# Patient Record
Sex: Female | Born: 1980 | Race: White | Hispanic: No | Marital: Single | Smoking: Current some day smoker
Health system: Southern US, Community
[De-identification: ages and names within clinical notes are randomized; demographics above are authoritative.]

---

## 2000-05-24 ENCOUNTER — Emergency Department (HOSPITAL_COMMUNITY): Admission: EM | Admit: 2000-05-24 | Discharge: 2000-05-24 | Payer: Self-pay | Admitting: Emergency Medicine

## 2000-09-12 ENCOUNTER — Emergency Department (HOSPITAL_COMMUNITY): Admission: EM | Admit: 2000-09-12 | Discharge: 2000-09-12 | Payer: Self-pay | Admitting: Emergency Medicine

## 2001-07-01 ENCOUNTER — Emergency Department (HOSPITAL_COMMUNITY): Admission: EM | Admit: 2001-07-01 | Discharge: 2001-07-01 | Payer: Self-pay

## 2001-07-10 ENCOUNTER — Emergency Department (HOSPITAL_COMMUNITY): Admission: EM | Admit: 2001-07-10 | Discharge: 2001-07-10 | Payer: Self-pay | Admitting: Emergency Medicine

## 2001-07-12 ENCOUNTER — Emergency Department (HOSPITAL_COMMUNITY): Admission: EM | Admit: 2001-07-12 | Discharge: 2001-07-12 | Payer: Self-pay | Admitting: Emergency Medicine

## 2001-07-12 ENCOUNTER — Encounter: Payer: Self-pay | Admitting: Emergency Medicine

## 2001-10-22 ENCOUNTER — Emergency Department (HOSPITAL_COMMUNITY): Admission: EM | Admit: 2001-10-22 | Discharge: 2001-10-22 | Payer: Self-pay | Admitting: Emergency Medicine

## 2001-11-06 ENCOUNTER — Emergency Department (HOSPITAL_COMMUNITY): Admission: EM | Admit: 2001-11-06 | Discharge: 2001-11-06 | Payer: Self-pay | Admitting: Emergency Medicine

## 2001-12-07 ENCOUNTER — Emergency Department (HOSPITAL_COMMUNITY): Admission: EM | Admit: 2001-12-07 | Discharge: 2001-12-07 | Payer: Self-pay

## 2002-04-08 ENCOUNTER — Encounter (INDEPENDENT_AMBULATORY_CARE_PROVIDER_SITE_OTHER): Payer: Self-pay | Admitting: Specialist

## 2002-04-08 ENCOUNTER — Ambulatory Visit (HOSPITAL_BASED_OUTPATIENT_CLINIC_OR_DEPARTMENT_OTHER): Admission: RE | Admit: 2002-04-08 | Discharge: 2002-04-08 | Payer: Self-pay | Admitting: Orthopedic Surgery

## 2002-04-23 ENCOUNTER — Emergency Department (HOSPITAL_COMMUNITY): Admission: EM | Admit: 2002-04-23 | Discharge: 2002-04-23 | Payer: Self-pay | Admitting: Emergency Medicine

## 2002-08-15 ENCOUNTER — Emergency Department (HOSPITAL_COMMUNITY): Admission: EM | Admit: 2002-08-15 | Discharge: 2002-08-15 | Payer: Self-pay

## 2002-10-15 ENCOUNTER — Emergency Department (HOSPITAL_COMMUNITY): Admission: EM | Admit: 2002-10-15 | Discharge: 2002-10-15 | Payer: Self-pay | Admitting: Emergency Medicine

## 2003-03-29 ENCOUNTER — Emergency Department (HOSPITAL_COMMUNITY): Admission: EM | Admit: 2003-03-29 | Discharge: 2003-03-29 | Payer: Self-pay | Admitting: Emergency Medicine

## 2003-06-28 ENCOUNTER — Emergency Department (HOSPITAL_COMMUNITY): Admission: EM | Admit: 2003-06-28 | Discharge: 2003-06-28 | Payer: Self-pay | Admitting: Emergency Medicine

## 2003-07-13 ENCOUNTER — Emergency Department (HOSPITAL_COMMUNITY): Admission: EM | Admit: 2003-07-13 | Discharge: 2003-07-13 | Payer: Self-pay | Admitting: Emergency Medicine

## 2004-03-17 ENCOUNTER — Emergency Department (HOSPITAL_COMMUNITY): Admission: EM | Admit: 2004-03-17 | Discharge: 2004-03-17 | Payer: Self-pay

## 2005-05-23 ENCOUNTER — Ambulatory Visit: Payer: Self-pay | Admitting: Internal Medicine

## 2005-06-08 ENCOUNTER — Ambulatory Visit: Payer: Self-pay | Admitting: *Deleted

## 2005-07-18 HISTORY — PX: WISDOM TOOTH EXTRACTION: SHX21

## 2006-03-03 ENCOUNTER — Emergency Department (HOSPITAL_COMMUNITY): Admission: EM | Admit: 2006-03-03 | Discharge: 2006-03-03 | Payer: Self-pay | Admitting: Emergency Medicine

## 2006-04-03 ENCOUNTER — Emergency Department (HOSPITAL_COMMUNITY): Admission: EM | Admit: 2006-04-03 | Discharge: 2006-04-03 | Payer: Self-pay | Admitting: Emergency Medicine

## 2006-08-23 ENCOUNTER — Inpatient Hospital Stay (HOSPITAL_COMMUNITY): Admission: AD | Admit: 2006-08-23 | Discharge: 2006-08-23 | Payer: Self-pay | Admitting: Obstetrics & Gynecology

## 2006-09-24 ENCOUNTER — Inpatient Hospital Stay (HOSPITAL_COMMUNITY): Admission: AD | Admit: 2006-09-24 | Discharge: 2006-09-29 | Payer: Self-pay | Admitting: Obstetrics and Gynecology

## 2006-09-26 ENCOUNTER — Encounter (INDEPENDENT_AMBULATORY_CARE_PROVIDER_SITE_OTHER): Payer: Self-pay | Admitting: Specialist

## 2009-01-26 ENCOUNTER — Emergency Department (HOSPITAL_COMMUNITY): Admission: EM | Admit: 2009-01-26 | Discharge: 2009-01-26 | Payer: Self-pay | Admitting: Emergency Medicine

## 2010-12-03 NOTE — Op Note (Signed)
Rebecca Meyers, Rebecca Meyers              ACCOUNT NO.:  [id number]   MEDICAL RECORD NO.:  [id number]          PATIENT TYPE:  INP   LOCATION:  9104                          FACILITY:  [facility]   PHYSICIAN:  Kendra H. Tenny Craw, MD     DATE OF BIRTH:  [birth year]   DATE OF PROCEDURE:  09/26/2006  DATE OF DISCHARGE:                               OPERATIVE REPORT   PREOPERATIVE DIAGNOSES:  1. A 36-3/7-week intrauterine pregnancy.  2. Preeclampsia.  3. Failed induction of labor.   POSTOPERATIVE DIAGNOSES:  1. A 36-3/7-week intrauterine pregnancy.  2. Preeclampsia.  3. Failed induction of labor.  4. Occipitoposterior presentation.   PROCEDURE:  Primary low transverse cesarean section via Pfannenstiel  skin incision.   SURGEON:  Freddrick March. Tenny Craw, MD   ASSISTANT:  None.   ANESTHESIA:  Epidural.   SPECIMEN:  Placenta to Pathology.   ESTIMATED BLOOD LOSS:  1700 mL.   COMPLICATIONS:  Lateral extension of the uterine incision on the right-  hand side; however, this did not extend to the level of the cervix.   PROCEDURE:  [name] is a G2, P0 who presented initially at 36 weeks  1 day on September 24, 2006 complaining of elevated blood pressures and  swelling.  At home, she was having blood pressures of 160s to 180s over  90-110.  She denied any symptoms of headache, vision changes or a right  upper quadrant, or epigastric pain; however, she proceeded to have  markedly elevated blood pressures in the 160s over 110s and 100 mg of  protein on a urinalysis.  She was admitted to the hospital for a 24-hour  urine and preeclampsia labs.  The patient's blood pressures continued to  remain elevated despite hospital bedrest and the decision was made to  proceed with an induction of labor for preeclampsia with misoprostol.  She was also started on magnesium sulfate at this time and at the  beginning of her induction, her cervix was closed, thick and high.  She  was started on Pitocin at midnight on September 26, 2006.  She remained  closed on the morning of September 26, 2006; however, she was manually made  to be 1-cm dilated.  She received clindamycin antibiotic for unknown GBS  status and later on underwent transcervical Foley catheter placement to  assist with dilation; this came out and the patient was 2-cm dilated.  Amniotomy was performed and an intrauterine pressure catheter was  placed.  Upon placement of the intrauterine pressure catheter, some  blood return was noted in the IUPC; this was flushed and the patient was  noted to have elevated pressures in the range of 60 mmHg.  The IUPC was  reset at 0 several times and flushed; however, the patient was reading  elevated uterine tone.  The intrauterine pressure catheter was then  removed and replaced.  Following replacement of the intrauterine  pressure catheter, the uterine tone remained elevated in the 40s and she  was noted to be having contractions every 1-2 minutes only measuring 10-  20 mmHg in strength.  Pitocin was continued; however, it  was decreased  due to the patient's elevated uterine tone.  Attempts to manipulate the  Pitocin infusion to provide an adequate labor contraction were  unsuccessful.  After 4 hours, the patient's cervix was unchanged and at  this point, the decision was made to proceed with primary cesarean  section for failure to progress.  Following the appropriate informed  consent, the patient was brought to the operating room, where she was  placed in the dorsal supine position with a leftward tilt.  Epidural  anesthesia was confirmed to be adequate.  She was prepped and draped in  the normal sterile fashion.  A scalpel was used to make a Pfannenstiel  skin incision, which was carried down to the underlying layers of soft  tissue to the fascia.  The fascia was then incised in the midline and  the fascial incision was extended laterally with Mayo scissors.  The  superior aspect of the fascial incision was grasped  with Kocher clamps  x2, tented up and the underlying rectus muscle was dissected off sharply  with the electrocautery unit; this procedure was repeated on the  inferior aspect of the fascial incision.  The abdominal peritoneum was  then identified, entered sharply with the Metzenbaum scissors and the  incision was extended superiorly and inferiorly with good visualization  of the bladder.  The bladder blade was then inserted.  The vesicouterine  peritoneum was identified, tented up and entered sharply with Metzenbaum  scissors and the incision was extended laterally and the bladder flap  was created digitally.  The bladder blade was reinserted and a scalpel  was used to make a low transverse incision on the uterus.  The uterine  incision was extended laterally with blunt dissection.  The amniotomy  was then performed and the uterus was explored.  The fetal vertex was  identified in the occipitoposterior presentation with a moderate amount  of caput.  The fetal head was brought up to the uterine incision and  delivered; the assistance of a vacuum was required to assist with  delivery of the fetal head.  The infant cried vigorously after delivery  of the fetal head and then the infant's body was subsequently delivered.  A nuchal cord x1 was noted.  Cord gases were collected.  The placenta  was then spontaneously delivered.  The uterus was exteriorized and  cleared of all clot and debris.  At this time it was noted that there  was a right lateral extension of the uterine incision down toward the  cervix, but not to the level of the cervix.  The uterine incision was  repaired with #1 Vicryl in a running-locked fashion.  A second  imbricating layer was performed.  The uterine incision was inspected and  found to be hemostatic.  The ovaries and tubes were inspected and found  to be normal.  The uterus was then returned to the abdominal cavity. The abdominal cavity was cleared of clot and debris.   Please note, prior  to the repair of the uterine incision, the uterus was noted to be  somewhat boggy, which was not surprising, given the duration of  magnesium sulfate and Pitocin that this patient received.  With the help  of the uterine closure and vigorous manual massage, the uterine tone  improved dramatically.  Following irrigation of the abdominal cavity,  the uterine incision was inspected again and found to be hemostatic.  The peritoneal incision was repaired with 2-0 Vicryl in a running  fashion  and the fascia was closed with a looped PDS in a running fashion  and the skin was closed with staples.  All sponge, lap and needle counts  were correct x2.  The patient tolerated the procedure well and was  brought to the recovery room in stable condition following the  procedure.      Freddrick March. Tenny Craw, MD  Electronically Signed     KHR/MEDQ  D:  09/26/2006  T:  09/28/2006  Job:  [id number]

## 2010-12-03 NOTE — Discharge Summary (Signed)
Rebecca Meyers, Rebecca Meyers              ACCOUNT NO.:  [id number]   MEDICAL RECORD NO.:  [id number]          PATIENT TYPE:  INP   LOCATION:  9104                          FACILITY:  [facility]   PHYSICIAN:  Carrington Clamp, M.D. DATE OF BIRTH:  [birth year]   DATE OF ADMISSION:  09/24/2006  DATE OF DISCHARGE:  09/29/2006                               DISCHARGE SUMMARY   FINAL DIAGNOSIS:  Intrauterine pregnancy at 36-3/[redacted] weeks gestation,  preeclampsia, failed induction of labor, _occiput posterior presentation   PROCEDURE:  Primary low transverse cesarean section.  Surgeon Dr. Waynard Reeds.   COMPLICATIONS:  A lateral extension of uterine incision on the right  hand side but this did not extend down to level of the cervix.   This 30 year old G2, P 0-0-1-0 presents at 36-1/[redacted] weeks gestation  complaining of swelling and was having some elevated blood pressures.  The patient denied any headache, visual changes or right upper quadrant  pain but because of these elevated blood pressures and protein in her  urine, she was admitted for a 24-hour urine and preeclampsia labs.  The  patient's blood pressure continued to be elevated despite bedrest and  decision was made at this point to start the patient on mag sulfate and  start her induction.  The patient's cervix was closed at this point.  The patient's antepartum course up to this point had been uncomplicated.  The patient does have a history of asthma but did not have any problems  during this current pregnancy.  She was started on Pitocin at midnight  on March11.  The patient's cervix remained closed and she was checked  the next morning.  She received clindamycin.  She had an unknown group B  strep status.  She underwent transcervical Foley catheter placement to  assist with dilation. The patient was at about 2 cm at this point.  Amniotomy was carried out and IUPCs  were placed.  The patient's IUPCs  was  blood returned noted.  It kept being reset  and had to end up being  removed.  The patient was contracting every 1-2 minutes but only  measuring 10-20 millimeters in strength.  After another 4 hours of this  patient's cervix was unchanged. At this point a diagnosis of failure to  progress was made and a decision was made to proceed with cesarean  section.  The patient was taken to the operating room on  March11,2008 by Dr. Waynard Reeds where a primary transverse  cesarean section was performed with the delivery of a 6 pounds 8 ounces  female infant with Apgars of nine and nine.  There was that  lateral  extension of the incision on the right-hand side but otherwise delivery  went without complication.  The patient's postoperative course was  benign without any significant fevers.  She was continued on mag sulfate  for 24 hours after delivery.  She was stable.  She was Meyers to be on the  mother-baby unit.  She wanted her little boy circumcised before  discharge.  She was felt ready for discharge on postoperative day #3 and  was sent home on a regular diet, told to decrease activities, told to  continue her prenatal vitamins and iron supplement for some  postoperative anemia, was given Percocet one to two over 4-6 hours as  needed for pain, was to follow up in our office in 4-6 weeks.  Of course  precautions were reviewed with the patient to return sooner as needed.   LABS ON DISCHARGE:  The patient had a hemoglobin of 8.3, white blood  cell count of 16.3, platelets of 286,000 and a PIH panel that was  relatively normal.  Also there was no group B strep isolated on her  group B strep culture,      Rebecca Meyers, P.A.-C.      Carrington Clamp, M.D.  Electronically Signed    MB/MEDQ  D:  11/07/2006  T:  11/07/2006  Job:  [id number]

## 2011-02-22 ENCOUNTER — Emergency Department (HOSPITAL_COMMUNITY)
Admission: EM | Admit: 2011-02-22 | Discharge: 2011-02-22 | Disposition: A | Discharge status: Home / Self Care | Payer: Self-pay | Attending: Emergency Medicine | Admitting: Emergency Medicine

## 2011-02-22 DIAGNOSIS — M545 Low back pain, unspecified: Secondary | ICD-10-CM | POA: Insufficient documentation

## 2011-02-22 DIAGNOSIS — M549 Dorsalgia, unspecified: Secondary | ICD-10-CM | POA: Insufficient documentation

## 2011-06-28 ENCOUNTER — Encounter: Payer: Self-pay | Admitting: Emergency Medicine

## 2011-06-28 ENCOUNTER — Emergency Department (HOSPITAL_COMMUNITY)
Admission: EM | Admit: 2011-06-28 | Discharge: 2011-06-28 | Disposition: A | Discharge status: Home / Self Care | Payer: Self-pay | Attending: Emergency Medicine | Admitting: Emergency Medicine

## 2011-06-28 DIAGNOSIS — R509 Fever, unspecified: Secondary | ICD-10-CM | POA: Insufficient documentation

## 2011-06-28 DIAGNOSIS — J069 Acute upper respiratory infection, unspecified: Secondary | ICD-10-CM

## 2011-06-28 DIAGNOSIS — IMO0001 Reserved for inherently not codable concepts without codable children: Secondary | ICD-10-CM | POA: Insufficient documentation

## 2011-06-28 DIAGNOSIS — F17200 Nicotine dependence, unspecified, uncomplicated: Secondary | ICD-10-CM | POA: Insufficient documentation

## 2011-06-28 DIAGNOSIS — J45909 Unspecified asthma, uncomplicated: Secondary | ICD-10-CM | POA: Insufficient documentation

## 2011-06-28 MED ORDER — HYDROCODONE-HOMATROPINE 5-1.5 MG/5ML PO SYRP: 5.0000 mL | ORAL_SOLUTION | Freq: Four times a day (QID) | ORAL | Status: AC | PRN

## 2011-06-28 MED ORDER — DOXYCYCLINE HYCLATE 50 MG PO CAPS: 100.0000 mg | ORAL_CAPSULE | Freq: Two times a day (BID) | ORAL | Status: AC

## 2011-06-28 NOTE — ED Notes (Signed)
Pt reports she started to feel weak two days ago and then started to have headaches at temples and sinus pressure. Pt reports nausea and vomiting today with three episodes of vomiting today. Pt also reports chest congestion with non-productive cough. Pt has a history of asthma and reports having to use her inhaler three times today for cough and shortness of breath.  Rhonchi in lower lobes that clear with cough. Pt in no distress at this time.

## 2011-06-28 NOTE — ED Notes (Signed)
Pt states her child has been sick w cold, reports chills off and on 3 days, coughing green sputum, c/o head "feels full". Denies nausea now but reports emesis x1 this am.

## 2011-06-28 NOTE — ED Provider Notes (Signed)
History     CSN: [id number] Arrival date & time: 06/28/2011 12:40 PM   First MD Initiated Contact with Patient 06/28/11 1828      Chief Complaint  Patient presents with  . Generalized Body Aches    (Consider location/radiation/quality/duration/timing/severity/associated sxs/prior treatment) Patient is a 30 y.o. female presenting with URI. The history is provided by the patient.  URI The primary symptoms include fever, fatigue, headaches, ear pain, sore throat, swollen glands, cough and wheezing. The current episode started more than 1 week ago. This is a new problem. The problem has been gradually worsening.  The onset of the illness is associated with exposure to sick contacts. Symptoms associated with the illness include chills, plugged ear sensation and facial pain.    Past Medical History  Diagnosis Date  . Asthma     Past Surgical History  Procedure Date  . Cesarean section     No family history on file.  History  Substance Use Topics  . Smoking status: Current Some Day Smoker -- 0.5 packs/day    Types: Cigarettes  . Smokeless tobacco: Not on file  . Alcohol Use: No    OB History    Grav Para Term Preterm Abortions TAB SAB Ect Mult Living                  Review of Systems  Constitutional: Positive for fever, chills and fatigue.  HENT: Positive for ear pain and sore throat.   Respiratory: Positive for cough and wheezing.   Neurological: Positive for headaches.  All other systems reviewed and are negative.    Allergies  Penicillins  Home Medications   Current Outpatient Rx  Name Route Sig Dispense Refill  . ALBUTEROL SULFATE HFA 108 (90 BASE) MCG/ACT IN AERS Inhalation Inhale 2 puffs into the lungs every 6 (six) hours as needed. For shortness of breath     . IBUPROFEN 200 MG PO TABS Oral Take 600 mg by mouth every 8 (eight) hours as needed. For pain.     Marland Kitchen LORATADINE 10 MG PO TABS Oral Take 10 mg by mouth daily.        BP 113/84  Pulse 81   Temp(Src) 101.3 F (38.5 C) (Oral)  Resp 12  SpO2 100%  LMP 05/26/2011  Physical Exam  Nursing note and vitals reviewed. Constitutional: She is oriented to person, place, and time. She appears well-developed and well-nourished. No distress.  HENT:  Head: Normocephalic and atraumatic.  Neck: Normal range of motion. Neck supple.  Cardiovascular: Normal rate and regular rhythm.  Exam reveals no gallop and no friction rub.   No murmur heard. Pulmonary/Chest: Effort normal and breath sounds normal. No respiratory distress. She has no wheezes.  Abdominal: Soft. Bowel sounds are normal. She exhibits no distension. There is no tenderness.  Musculoskeletal: Normal range of motion.  Neurological: She is alert and oriented to person, place, and time.  Skin: Skin is warm and dry. She is not diaphoretic.    ED Course  Procedures (including critical care time)  Labs Reviewed - No data to display No results found.   No diagnosis found.    MDM  Sick for a week, not getting better with otc meds.  Will treat with doxy, hycodan.        Geoffery Lyons, MD 06/28/11 [phone]

## 2012-09-22 ENCOUNTER — Encounter (HOSPITAL_COMMUNITY): Payer: Self-pay | Admitting: *Deleted

## 2012-09-22 ENCOUNTER — Emergency Department (HOSPITAL_COMMUNITY)
Admission: EM | Admit: 2012-09-22 | Discharge: 2012-09-22 | Disposition: A | Discharge status: Home / Self Care | Payer: Self-pay | Source: Home / Self Care

## 2012-09-22 MED ORDER — PREDNISONE 5 MG PO TABS: ORAL_TABLET | ORAL | Status: DC

## 2012-09-22 NOTE — ED Notes (Signed)
Patient complains of chest and head congestion with cough x 4 days. Denies fever/chills. States cough was worse after sleeping in a room with kerosene heater last night.

## 2012-09-22 NOTE — ED Provider Notes (Signed)
Patient Demographics  Rebecca Meyers, is a 32 y.o. female  ZOX:[id number]  WJX:[id number]  DOB - [birth year]  Chief Complaint  Patient presents with  . URI        Subjective:   Rebecca Meyers today here for some nasal drainage, postnasal drip, cough, and mild wheezing, symptoms got little worse when she slept in the house with a kerosene heater on, no shortness of breath, no chest pain, no fever or chills.  Objective:    Filed Vitals:   09/22/12 1547  BP: 130/85  Pulse: 73  Temp: 98.2 F (36.8 C)  TempSrc: Oral  Resp: 16  SpO2: 96%     Exam  Awake Alert, Oriented X 3, No new F.N deficits, Normal affect [place].AT,PERRAL Supple Neck,No JVD, No cervical lymphadenopathy appriciated.  Symmetrical Chest wall movement, Good air movement bilaterally, mild wheezing and postnasal drip RRR,No Gallops,Rubs or new Murmurs, No Parasternal Heave +ve B.Sounds, Abd Soft, Non tender, No organomegaly appriciated, No rebound - guarding or rigidity. No Cyanosis, Clubbing or edema, No new Rash or bruise     Data Review   CBC No results found for this basename: WBC, HGB, HCT, PLT, MCV, MCH, MCHC, RDW, NEUTRABS, LYMPHSABS, MONOABS, EOSABS, BASOSABS, BANDABS, BANDSABD,  in the last 168 hours  Chemistries   No results found for this basename: NA, K, CL, CO2, GLUCOSE, BUN, CREATININE, GFRCGP, CALCIUM, MG, AST, ALT, ALKPHOS, BILITOT,  in the last 168 hours ------------------------------------------------------------------------------------------------------------------ No results found for this basename: HGBA1C,  in the last 72 hours ------------------------------------------------------------------------------------------------------------------ No results found for this basename: CHOL, HDL, LDLCALC, TRIG, CHOLHDL, LDLDIRECT,  in the last 72 hours ------------------------------------------------------------------------------------------------------------------ No results found for this  basename: TSH, T4TOTAL, FREET3, T3FREE, THYROIDAB,  in the last 72 hours ------------------------------------------------------------------------------------------------------------------ No results found for this basename: VITAMINB12, FOLATE, FERRITIN, TIBC, IRON, RETICCTPCT,  in the last 72 hours  Coagulation profile  No results found for this basename: INR, PROTIME,  in the last 168 hours     Prior to Admission medications   Medication Sig Start Date End Date Taking? Authorizing Provider  albuterol (PROVENTIL HFA;VENTOLIN HFA) 108 (90 BASE) MCG/ACT inhaler Inhale 2 puffs into the lungs every 6 (six) hours as needed. For shortness of breath     Historical Provider, MD  ibuprofen (ADVIL,MOTRIN) 200 MG tablet Take 600 mg by mouth every 8 (eight) hours as needed. For pain.     Historical Provider, MD  loratadine (CLARITIN) 10 MG tablet Take 10 mg by mouth daily.      Historical Provider, MD  predniSONE (DELTASONE) 5 MG tablet Label  & dispense according to the schedule below. 6 Pills PO for 3 days, 4 Pills PO for 3 days, 2 Pills PO for 3 days, 1 Pills PO for 3 days, 1/2 Pill  PO for 3 days then STOP. 09/22/12   Leroy Sea, MD     Assessment & Plan   Allergic bronchitis. We'll give her a short course of prednisone, continue taking her Claritin and when necessary nebulizer treatments as before. Instructions given to seek medical attention if she gets worse i.e. fevers or shortness of breath .    Follow-up Information   Follow up with PCP. Schedule an appointment as soon as possible for a visit in 1 week. (As needed)        Leroy Sea M.D on 09/22/2012 at 3:48 PM   Leroy Sea, MD 09/22/12 [phone]

## 2012-11-27 ENCOUNTER — Encounter (HOSPITAL_COMMUNITY): Payer: Self-pay | Admitting: *Deleted

## 2012-11-27 ENCOUNTER — Emergency Department (INDEPENDENT_AMBULATORY_CARE_PROVIDER_SITE_OTHER)
Admission: EM | Admit: 2012-11-27 | Discharge: 2012-11-27 | Disposition: A | Discharge status: Home / Self Care | Payer: Self-pay | Source: Home / Self Care | Attending: Emergency Medicine | Admitting: Emergency Medicine

## 2012-11-27 DIAGNOSIS — J209 Acute bronchitis, unspecified: Secondary | ICD-10-CM

## 2012-11-27 DIAGNOSIS — G43909 Migraine, unspecified, not intractable, without status migrainosus: Secondary | ICD-10-CM

## 2012-11-27 DIAGNOSIS — I10 Essential (primary) hypertension: Secondary | ICD-10-CM

## 2012-11-27 DIAGNOSIS — J019 Acute sinusitis, unspecified: Secondary | ICD-10-CM

## 2012-11-27 LAB — POCT I-STAT, CHEM 8
Calcium, Ion: 1.29 mmol/L — ABNORMAL HIGH (ref 1.12–1.23)
Creatinine, Ser: 0.7 mg/dL (ref 0.50–1.10)
Glucose, Bld: 92 mg/dL (ref 70–99)
HCT: 47 % — ABNORMAL HIGH (ref 36.0–46.0)
Hemoglobin: 16 g/dL — ABNORMAL HIGH (ref 12.0–15.0)
TCO2: 25 mmol/L (ref 0–100)

## 2012-11-27 LAB — POCT URINALYSIS DIP (DEVICE)
Bilirubin Urine: NEGATIVE
Hgb urine dipstick: NEGATIVE
Ketones, ur: NEGATIVE mg/dL
Leukocytes, UA: NEGATIVE

## 2012-11-27 LAB — POCT PREGNANCY, URINE: Preg Test, Ur: NEGATIVE

## 2012-11-27 MED ORDER — ERYTHROMYCIN BASE 333 MG PO TBEC: 333.0000 mg | DELAYED_RELEASE_TABLET | Freq: Four times a day (QID) | ORAL | Status: DC

## 2012-11-27 MED ORDER — KETOROLAC TROMETHAMINE 60 MG/2ML IM SOLN
INTRAMUSCULAR | Status: AC
  Filled 2012-11-27: qty 2

## 2012-11-27 MED ORDER — DIPHENHYDRAMINE HCL 50 MG/ML IJ SOLN
25.0000 mg | Freq: Once | INTRAMUSCULAR | Status: AC
  Administered 2012-11-27: 25 mg via INTRAMUSCULAR

## 2012-11-27 MED ORDER — DEXAMETHASONE SODIUM PHOSPHATE 10 MG/ML IJ SOLN
10.0000 mg | Freq: Once | INTRAMUSCULAR | Status: AC
  Administered 2012-11-27: 10 mg via INTRAMUSCULAR

## 2012-11-27 MED ORDER — HYDROCHLOROTHIAZIDE 12.5 MG PO CAPS: 12.5000 mg | ORAL_CAPSULE | Freq: Every day | ORAL | Status: DC

## 2012-11-27 MED ORDER — DIPHENHYDRAMINE HCL 50 MG/ML IJ SOLN
INTRAMUSCULAR | Status: AC
  Filled 2012-11-27: qty 1

## 2012-11-27 MED ORDER — SUMATRIPTAN SUCCINATE 50 MG PO TABS: 50.0000 mg | ORAL_TABLET | ORAL | Status: DC | PRN

## 2012-11-27 MED ORDER — KETOROLAC TROMETHAMINE 60 MG/2ML IM SOLN
60.0000 mg | Freq: Once | INTRAMUSCULAR | Status: AC
  Administered 2012-11-27: 60 mg via INTRAMUSCULAR

## 2012-11-27 MED ORDER — DEXAMETHASONE SODIUM PHOSPHATE 10 MG/ML IJ SOLN
INTRAMUSCULAR | Status: AC
  Filled 2012-11-27: qty 1

## 2012-11-27 NOTE — ED Provider Notes (Addendum)
Chief Complaint:   Chief Complaint  Patient presents with  . Headache    History of Present Illness:   Rebecca Meyers is a 32 year old female who presents today with headache, nervousness, elevated blood pressure, and URI symptoms. All these symptoms have been bothering her for about a week. She's had a week long history of a headache involving the entire head. The pain is throbbing in nature and at times described as a 10 over 10 in intensity, but right now as an 8/10. It's been associated with nausea, vomiting, photophobia, and phonophobia. It's not worse with activity, but does seem to get better she lies down. She's felt somewhat dizzy, and noted that her right hand feels numb and tingly. She denies any visual symptoms or neurological complaints. She's had no fever or stiff neck. The patient feels nervous and anxious. She recently had a death in the family. She checked her blood pressure yesterday and it was as high as 168/102. She has had mildly elevated blood pressures in the past but never been treated for such. She took her aunt's hydrochlorothiazide and it's down a little bit today. The patient states that she has had frequent headaches in the past but never been formally diagnosed as having migraines. The headaches occur about a week before her menses. These have been going on since the birth of her child 6 years ago. Also for the past week she's had nasal congestion with clear drainage and a cough productive green sputum. She denies any sore throat, wheezing, chest pain, or diarrhea. She has had some sweats.  Review of Systems:  Other than noted above, the patient denies any of the following symptoms: Systemic:  No fever, chills, fatigue, photophobia, stiff neck. Eye:  No redness, eye pain, discharge, blurred vision, or diplopia. ENT:  No nasal congestion, rhinorrhea, sinus pressure or pain, sneezing, earache, or sore throat.  No jaw claudication. Neuro:  No paresthesias, loss of  consciousness, seizure activity, muscle weakness, trouble with coordination or gait, trouble speaking or swallowing. Psych:  No depression, anxiety or trouble sleeping.  PMFSH:  Past medical history, family history, social history, meds, and allergies were reviewed.  She is allergic to penicillin. She takes albuterol for asthma. Last menstrual period was a month ago. She is sexually active and not using any birth control.  Physical Exam:   Vital signs:  BP 150/96  Pulse 82  Temp(Src) 99.1 F (37.3 C) (Oral)  SpO2 99%  LMP 10/28/2012 General:  Alert and oriented.  In no distress. Eye:  Lids and conjunctivas normal.  PERRL,  Full EOMs.  Fundi benign with normal discs and vessels. ENT:  No cranial or facial tenderness to palpation.  TMs and canals clear.  Nasal mucosa was normal and uncongested without any drainage. No intra oral lesions, pharynx clear, mucous membranes moist, dentition normal. Neck:  Supple, full ROM, no tenderness to palpation.  No adenopathy or mass. Lungs: Clear to auscultation. Heart: Regular rhythm, no gallop or murmur. Neuro:  Alert and orented times 3.  Speech was clear, fluent, and appropriate.  Cranial nerves intact. No pronator drift, muscle strength normal. Finger to nose normal.  DTRs were 2+ and symmetrical.Station and gait were normal.  Romberg's sign was normal.  Able to perform tandem gait well. Psych:  Normal affect.  Course in Urgent Care Center:   Given Toradol 60 mg IM, Decadron 10 mg IM, and Benadryl 25 mg IM.  Assessment:  The primary encounter diagnosis was Migraine headache. Diagnoses of  Acute sinusitis, Acute bronchitis, and Hypertension were also pertinent to this visit.  Plan:   1.  The following meds were prescribed:   Discharge Medication List as of 11/27/2012  7:02 PM    START taking these medications   Details  erythromycin (ERY-TAB) 333 MG EC tablet Take 1 tablet (333 mg total) by mouth 4 (four) times daily., Starting 11/27/2012, Until  Discontinued, Normal    hydrochlorothiazide (MICROZIDE) 12.5 MG capsule Take 1 capsule (12.5 mg total) by mouth daily., Starting 11/27/2012, Until Discontinued, Normal    SUMAtriptan (IMITREX) 50 MG tablet Take 1 tablet (50 mg total) by mouth every 2 (two) hours as needed for migraine., Starting 11/27/2012, Until Discontinued, Print       2.  The patient was instructed in symptomatic care and handouts were given. 3.  The patient was told to return if becoming worse in any way, if no better in 3 or 4 days, and given some red flag symptoms such as fever or any neurological symptoms that would indicate earlier return. 4.  Follow up at [facility] and [facility] for followup on her blood pressure.    Reuben Likes, MD 11/27/12 2033  Reuben Likes, MD 11/27/12 [phone]

## 2012-11-27 NOTE — ED Notes (Signed)
Frontal headache x 1 week. Hx. HTN (Eclampsia)x 6 years go. BP has been elevated x 1 week with diastolic over 100.

## 2012-11-27 NOTE — ED Notes (Signed)
Patient called w questions about her Rx 

## 2012-11-28 ENCOUNTER — Telehealth (HOSPITAL_COMMUNITY): Payer: Self-pay | Admitting: Emergency Medicine

## 2012-11-28 NOTE — ED Notes (Signed)
Pt called; sated that Erythromycin was very expensive and was told by pharmacist at [facility]) to switch to Z-Pak Per Dr. Ladon Applebaum, ok to switch... 2 tabs together and one tablet there after...6 to be dispensed... No refills Called in new med to [facility])

## 2012-11-29 NOTE — ED Notes (Signed)
Patient called w questions about her BP medications, BP remaining up after starting Rx, anxious on phone

## 2013-10-04 ENCOUNTER — Encounter (HOSPITAL_COMMUNITY): Payer: Self-pay | Admitting: Emergency Medicine

## 2013-10-04 ENCOUNTER — Emergency Department (INDEPENDENT_AMBULATORY_CARE_PROVIDER_SITE_OTHER): Payer: Self-pay

## 2013-10-04 ENCOUNTER — Emergency Department (INDEPENDENT_AMBULATORY_CARE_PROVIDER_SITE_OTHER)
Admission: EM | Admit: 2013-10-04 | Discharge: 2013-10-04 | Disposition: A | Discharge status: Home / Self Care | Payer: Self-pay | Source: Home / Self Care | Attending: Family Medicine | Admitting: Family Medicine

## 2013-10-04 DIAGNOSIS — J069 Acute upper respiratory infection, unspecified: Secondary | ICD-10-CM

## 2013-10-04 MED ORDER — PSEUDOEPHEDRINE HCL 30 MG PO TABS: 30.0000 mg | ORAL_TABLET | Freq: Three times a day (TID) | ORAL | Status: DC | PRN

## 2013-10-04 MED ORDER — BENZONATATE 100 MG PO CAPS: 100.0000 mg | ORAL_CAPSULE | Freq: Three times a day (TID) | ORAL | Status: DC | PRN

## 2013-10-04 MED ORDER — IPRATROPIUM BROMIDE 0.06 % NA SOLN: 2.0000 | Freq: Four times a day (QID) | NASAL | Status: DC

## 2013-10-04 NOTE — ED Notes (Signed)
Facial pain, green secretions, cough. syx x 4 days. Reported frequent incidents of pneumonia

## 2013-10-04 NOTE — ED Provider Notes (Signed)
CSN: [id number]     Arrival date & time 10/04/13  0802 History   First MD Initiated Contact with Patient 10/04/13 0830     Chief Complaint  Patient presents with  . Facial Pain   (Consider location/radiation/quality/duration/timing/severity/associated sxs/prior Treatment) Patient is a 33 y.o. female presenting with URI. The history is provided by the patient.  URI Presenting symptoms: congestion, cough, ear pain and fatigue   Presenting symptoms: no fever, no rhinorrhea and no sore throat   Presenting symptoms comment:  +nasal congestion Severity:  Moderate Onset quality:  Gradual Duration:  3 days Timing:  Constant Chronicity:  New Associated symptoms: sinus pain   Associated symptoms: no headaches, no myalgias, no neck pain and no swollen glands   Risk factors: sick contacts   Risk factors comment:  +son ill with same   Past Medical History  Diagnosis Date  . Asthma    Past Surgical History  Procedure Laterality Date  . Cesarean section    . Wisdom tooth extraction Bilateral 2007    all wisdom teeth extracted   History reviewed. No pertinent family history. History  Substance Use Topics  . Smoking status: Current Some Day Smoker -- 0.50 packs/day    Types: Cigarettes  . Smokeless tobacco: Never Used  . Alcohol Use: No   OB History   Grav Para Term Preterm Abortions TAB SAB Ect Mult Living                 Review of Systems  Constitutional: Positive for fatigue. Negative for fever.  HENT: Positive for congestion and ear pain. Negative for rhinorrhea and sore throat.   Respiratory: Positive for cough.   Musculoskeletal: Negative for myalgias and neck pain.  Neurological: Negative for headaches.  All other systems reviewed and are negative.    Allergies  Penicillins  Home Medications   Current Outpatient Rx  Name  Route  Sig  Dispense  Refill  . albuterol (PROVENTIL HFA;VENTOLIN HFA) 108 (90 BASE) MCG/ACT inhaler   Inhalation   Inhale 2 puffs into the  lungs every 6 (six) hours as needed. For shortness of breath          . benzonatate (TESSALON) 100 MG capsule   Oral   Take 100 mg by mouth 3 (three) times daily as needed for cough.         . benzonatate (TESSALON) 100 MG capsule   Oral   Take 1 capsule (100 mg total) by mouth 3 (three) times daily as needed for cough.   21 capsule   0   . erythromycin (ERY-TAB) 333 MG EC tablet   Oral   Take 1 tablet (333 mg total) by mouth 4 (four) times daily.   30 tablet   0   . hydrochlorothiazide (MICROZIDE) 12.5 MG capsule   Oral   Take 1 capsule (12.5 mg total) by mouth daily.   30 capsule   0   . ibuprofen (ADVIL,MOTRIN) 200 MG tablet   Oral   Take 600 mg by mouth every 8 (eight) hours as needed. For pain.          Marland Kitchen ipratropium (ATROVENT) 0.06 % nasal spray   Each Nare   Place 2 sprays into both nostrils 4 (four) times daily.   15 mL   1   . loratadine (CLARITIN) 10 MG tablet   Oral   Take 10 mg by mouth daily.           . predniSONE (DELTASONE) 5  MG tablet      Label  & dispense according to the schedule below. 6 Pills PO for 3 days, 4 Pills PO for 3 days, 2 Pills PO for 3 days, 1 Pills PO for 3 days, 1/2 Pill  PO for 3 days then STOP.   45 tablet   0     Dispense as written.   . pseudoephedrine (SUDAFED) 30 MG tablet   Oral   Take 1 tablet (30 mg total) by mouth every 8 (eight) hours as needed for congestion.   30 tablet   0   . SUMAtriptan (IMITREX) 50 MG tablet   Oral   Take 1 tablet (50 mg total) by mouth every 2 (two) hours as needed for migraine.   10 tablet   0    BP 125/74  Pulse 100  Temp(Src) 98.6 F (37 C) (Oral)  Resp 14  SpO2 100%  LMP 09/27/2013 Physical Exam  Nursing note and vitals reviewed. Constitutional: She is oriented to person, place, and time. She appears well-developed and well-nourished. No distress.  HENT:  Head: Normocephalic and atraumatic.  Right Ear: Hearing, tympanic membrane, external ear and ear canal normal.   Left Ear: Hearing, tympanic membrane, external ear and ear canal normal.  Nose: Nose normal.  Mouth/Throat: Uvula is midline, oropharynx is clear and moist and mucous membranes are normal.  Eyes: Conjunctivae are normal. Right eye exhibits no discharge. Left eye exhibits no discharge. No scleral icterus.  Neck: Normal range of motion. Neck supple.  Cardiovascular: Normal rate, regular rhythm and normal heart sounds.   Pulmonary/Chest: Effort normal and breath sounds normal. No respiratory distress. She has no wheezes.  Abdominal: Soft. Bowel sounds are normal. She exhibits no distension. There is no tenderness.  Musculoskeletal: Normal range of motion.  Lymphadenopathy:    She has no cervical adenopathy.  Neurological: She is alert and oriented to person, place, and time.  Skin: Skin is warm and dry. No rash noted.  Psychiatric: She has a normal mood and affect. Her behavior is normal.    ED Course  Procedures (including critical care time) Labs Review Labs Reviewed - No data to display Imaging Review Dg Chest 2 View  10/04/2013   CLINICAL DATA:  Cough and congestion  EXAM: CHEST  2 VIEW  COMPARISON:  04/03/2006  FINDINGS: The heart size and mediastinal contours are within normal limits. Both lungs are clear. The visualized skeletal structures are unremarkable.  IMPRESSION: No active cardiopulmonary disease.   Electronically Signed   By: Alcide CleverMark  Lukens M.D.   On: 10/04/2013 09:23     MDM   1. URI (upper respiratory infection)    URI/commond cold: tessalon, atrovent nasal spray, sudafed, tylenol or ibuprofen at home with plenty of fluids and rest.   Ardis RowanJennifer Lee Presson, PA 10/04/13 1022

## 2013-10-04 NOTE — Discharge Instructions (Signed)
Antibiotic Nonuse  Your caregiver felt that the infection or problem was not one that would be helped with an antibiotic. Infections may be caused by viruses or bacteria. Only a caregiver can tell which one of these is the likely cause of an illness. A cold is the most common cause of infection in both adults and children. A cold is a virus. Antibiotic treatment will have no effect on a viral infection. Viruses can lead to many lost days of work caring for sick children and many missed days of school. Children may catch as many as 10 "colds" or "flus" per year during which they can be tearful, cranky, and uncomfortable. The goal of treating a virus is aimed at keeping the ill person comfortable. Antibiotics are medications used to help the body fight bacterial infections. There are relatively few types of bacteria that cause infections but there are hundreds of viruses. While both viruses and bacteria cause infection they are very different types of germs. A viral infection will typically go away by itself within 7 to 10 days. Bacterial infections may spread or get worse without antibiotic treatment. Examples of bacterial infections are:  Sore throats (like strep throat or tonsillitis).  Infection in the lung (pneumonia).  Ear and skin infections. Examples of viral infections are:  Colds or flus.  Most coughs and bronchitis.  Sore throats not caused by Strep.  Runny noses. It is often best not to take an antibiotic when a viral infection is the cause of the problem. Antibiotics can kill off the helpful bacteria that we have inside our body and allow harmful bacteria to start growing. Antibiotics can cause side effects such as allergies, nausea, and diarrhea without helping to improve the symptoms of the viral infection. Additionally, repeated uses of antibiotics can cause bacteria inside of our body to become resistant. That resistance can be passed onto harmful bacterial. The next time you have  an infection it may be harder to treat if antibiotics are used when they are not needed. Not treating with antibiotics allows our own immune system to develop and take care of infections more efficiently. Also, antibiotics will work better for [place] when they are prescribed for bacterial infections. Treatments for a child that is ill may include:  Give extra fluids throughout the day to stay hydrated.  Get plenty of rest.  Only give your child over-the-counter or prescription medicines for pain, discomfort, or fever as directed by your caregiver.  The use of a cool mist humidifier may help stuffy noses.  Cold medications if suggested by your caregiver. Your caregiver may decide to start you on an antibiotic if:  The problem you were seen for today continues for a longer length of time than expected.  You develop a secondary bacterial infection. SEEK MEDICAL CARE IF:  Fever lasts longer than 5 days.  Symptoms continue to get worse after 5 to 7 days or become severe.  Difficulty in breathing develops.  Signs of dehydration develop (poor drinking, rare urinating, dark colored urine).  Changes in behavior or worsening tiredness (listlessness or lethargy). Document Released: 09/12/2001 Document Revised: 09/26/2011 Document Reviewed: 03/11/2009 [facility] Patient Information 2014 [place], [place].  Your chest xray was normal and your exam does not suggest the need for antibiotics. You have a common cold. Please use medications as prescribed and drink plenty of water and rest as you needed to. I would expect your symptoms to improve over the 5-7 days. Please use tylenol or ibuprofen as directed on  packaging for pain.

## 2013-10-06 NOTE — ED Provider Notes (Signed)
Medical screening examination/treatment/procedure(s) were performed by resident physician or non-physician practitioner and as supervising physician I was immediately available for consultation/collaboration.   KINDL,JAMES DOUGLAS MD.   James D Kindl, MD 10/06/13 1017 

## 2014-04-12 ENCOUNTER — Emergency Department (HOSPITAL_COMMUNITY)
Admission: EM | Admit: 2014-04-12 | Discharge: 2014-04-12 | Disposition: A | Discharge status: Home / Self Care | Payer: Self-pay | Attending: Emergency Medicine | Admitting: Emergency Medicine

## 2014-04-12 ENCOUNTER — Encounter (HOSPITAL_COMMUNITY): Payer: Self-pay | Admitting: Emergency Medicine

## 2014-04-12 DIAGNOSIS — F17200 Nicotine dependence, unspecified, uncomplicated: Secondary | ICD-10-CM | POA: Insufficient documentation

## 2014-04-12 DIAGNOSIS — Z3202 Encounter for pregnancy test, result negative: Secondary | ICD-10-CM | POA: Insufficient documentation

## 2014-04-12 DIAGNOSIS — S161XXA Strain of muscle, fascia and tendon at neck level, initial encounter: Secondary | ICD-10-CM

## 2014-04-12 DIAGNOSIS — S46909A Unspecified injury of unspecified muscle, fascia and tendon at shoulder and upper arm level, unspecified arm, initial encounter: Secondary | ICD-10-CM | POA: Insufficient documentation

## 2014-04-12 DIAGNOSIS — Y92009 Unspecified place in unspecified non-institutional (private) residence as the place of occurrence of the external cause: Secondary | ICD-10-CM | POA: Insufficient documentation

## 2014-04-12 DIAGNOSIS — S139XXA Sprain of joints and ligaments of unspecified parts of neck, initial encounter: Secondary | ICD-10-CM | POA: Insufficient documentation

## 2014-04-12 DIAGNOSIS — Y93E5 Activity, floor mopping and cleaning: Secondary | ICD-10-CM | POA: Insufficient documentation

## 2014-04-12 DIAGNOSIS — Z792 Long term (current) use of antibiotics: Secondary | ICD-10-CM | POA: Insufficient documentation

## 2014-04-12 DIAGNOSIS — Z880 Allergy status to penicillin: Secondary | ICD-10-CM | POA: Insufficient documentation

## 2014-04-12 DIAGNOSIS — X503XXA Overexertion from repetitive movements, initial encounter: Secondary | ICD-10-CM | POA: Insufficient documentation

## 2014-04-12 DIAGNOSIS — M5412 Radiculopathy, cervical region: Secondary | ICD-10-CM | POA: Insufficient documentation

## 2014-04-12 DIAGNOSIS — J45909 Unspecified asthma, uncomplicated: Secondary | ICD-10-CM | POA: Insufficient documentation

## 2014-04-12 DIAGNOSIS — Z79899 Other long term (current) drug therapy: Secondary | ICD-10-CM | POA: Insufficient documentation

## 2014-04-12 DIAGNOSIS — S4980XA Other specified injuries of shoulder and upper arm, unspecified arm, initial encounter: Secondary | ICD-10-CM | POA: Insufficient documentation

## 2014-04-12 MED ORDER — HYDROCODONE-ACETAMINOPHEN 5-325 MG PO TABS: 1.0000 | ORAL_TABLET | Freq: Four times a day (QID) | ORAL | Status: DC | PRN

## 2014-04-12 MED ORDER — PREDNISONE 50 MG PO TABS: 50.0000 mg | ORAL_TABLET | Freq: Every day | ORAL | Status: DC

## 2014-04-12 MED ORDER — DIAZEPAM 5 MG PO TABS: 5.0000 mg | ORAL_TABLET | Freq: Three times a day (TID) | ORAL | Status: DC | PRN

## 2014-04-12 NOTE — ED Provider Notes (Signed)
CSN: [id number]     Arrival date & time 04/12/14  1548 History  This chart was scribed for a non-physician practitioner, Carlyle Dolly, PA-C working with Raeford Razor, MD by [place] Peace, ED Scribe. The patient was seen in [facility]. The patient's care was started at 4:20 PM.    Chief Complaint  Patient presents with  . Arm Pain      HPI HPI Comments: Rebecca Meyers is a 33 y.o. female who presents to the Emergency Department complaining of arm pain onset 1 week ago specifically to the distal aspect of her forearm that has radiated upward into the medial aspect of her humerus and into her neck. She reports that the pain is "unbearable" and describes it as feeling like a "spasm" and very "stiff". 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 She notes taking Ibuprofen and Tylenol without relief. She further states that she doesn't have insurance. Pt is current everyday smoker.    Past Medical History  Diagnosis Date  . Asthma    Past Surgical History  Procedure Laterality Date  . Cesarean section    . Wisdom tooth extraction Bilateral 2007    all wisdom teeth extracted   No family history on file. History  Substance Use Topics  . Smoking status: Current Some Day Smoker -- 0.50 packs/day    Types: Cigarettes  . Smokeless tobacco: Never Used  . Alcohol Use: No   OB History   Grav Para Term Preterm Abortions TAB SAB Ect Mult Living                 Review of Systems  All other systems reviewed and are negative.  All other systems negative except as documented in the HPI. All pertinent positives and negatives as reviewed in the HPI.    Allergies  Penicillins  Home Medications   Prior to Admission medications   Medication Sig Start Date End Date Taking? Authorizing Provider  albuterol (PROVENTIL HFA;VENTOLIN HFA) 108 (90 BASE) MCG/ACT inhaler Inhale 2  puffs into the lungs every 6 (six) hours as needed. For shortness of breath     Historical Provider, MD  benzonatate (TESSALON) 100 MG capsule Take 100 mg by mouth 3 (three) times daily as needed for cough.    Historical Provider, MD  benzonatate (TESSALON) 100 MG capsule Take 1 capsule (100 mg total) by mouth 3 (three) times daily as needed for cough. 10/04/13   Mathis Fare Presson, PA  erythromycin (ERY-TAB) 333 MG EC tablet Take 1 tablet (333 mg total) by mouth 4 (four) times daily. 11/27/12   Reuben Likes, MD  hydrochlorothiazide (MICROZIDE) 12.5 MG capsule Take 1 capsule (12.5 mg total) by mouth daily. 11/27/12   Reuben Likes, MD  ibuprofen (ADVIL,MOTRIN) 200 MG tablet Take 600 mg by mouth every 8 (eight) hours as needed. For pain.     Historical Provider, MD  ipratropium (ATROVENT) 0.06 % nasal spray Place 2 sprays into both nostrils 4 (four) times daily. 10/04/13   Mathis Fare Presson, PA  loratadine (CLARITIN) 10 MG tablet Take 10 mg by mouth daily.      Historical Provider, MD  predniSONE (DELTASONE) 5 MG tablet Label  & dispense according to the schedule below. 6 Pills PO for 3 days, 4 Pills PO for 3 days, 2 Pills PO for 3 days, 1 Pills  PO for 3 days, 1/2 Pill  PO for 3 days then STOP. 09/22/12   Leroy Sea, MD  pseudoephedrine (SUDAFED) 30 MG tablet Take 1 tablet (30 mg total) by mouth every 8 (eight) hours as needed for congestion. 10/04/13   Mathis Fare Presson, PA  SUMAtriptan (IMITREX) 50 MG tablet Take 1 tablet (50 mg total) by mouth every 2 (two) hours as needed for migraine. 11/27/12   Reuben Likes, MD   BP 143/88  Pulse 76  Temp(Src) 97.7 F (36.5 C) (Oral)  Resp 18  SpO2 100%  LMP 03/22/2014 Physical Exam  Nursing note and vitals reviewed. Constitutional: She is oriented to person, place, and time. She appears well-developed and well-nourished. No distress.  HENT:  Head: Normocephalic and atraumatic.  Cardiovascular: Normal rate.   Pulmonary/Chest: Effort  normal and breath sounds normal.  Musculoskeletal: Normal range of motion.       Cervical back: She exhibits tenderness. She exhibits normal range of motion and no bony tenderness.       Back:       Arms: Neurological: She is alert and oriented to person, place, and time. She has normal reflexes. She exhibits normal muscle tone. Coordination normal.  Skin: Skin is warm and dry.  Psychiatric: She has a normal mood and affect. Her behavior is normal.    ED Course  Procedures (including critical care time) Labs Review Labs Reviewed - No data to display  Results for orders placed during the hospital encounter of 11/27/12  POCT URINALYSIS DIP (DEVICE)      Result Value Ref Range   Glucose, UA NEGATIVE  NEGATIVE mg/dL   Bilirubin Urine NEGATIVE  NEGATIVE   Ketones, ur NEGATIVE  NEGATIVE mg/dL   Specific Gravity, Urine 1.025  1.005 - 1.030   Hgb urine dipstick NEGATIVE  NEGATIVE   pH 6.0  5.0 - 8.0   Protein, ur 30 (*) NEGATIVE mg/dL   Urobilinogen, UA 0.2  0.0 - 1.0 mg/dL   Nitrite NEGATIVE  NEGATIVE   Leukocytes, UA NEGATIVE  NEGATIVE  POCT PREGNANCY, URINE      Result Value Ref Range   Preg Test, Ur NEGATIVE  NEGATIVE  POCT I-STAT, CHEM 8      Result Value Ref Range   Sodium 142  135 - 145 mEq/L   Potassium 4.0  3.5 - 5.1 mEq/L   Chloride 106  96 - 112 mEq/L   BUN 16  6 - 23 mg/dL   Creatinine, Ser 0.98  0.50 - 1.10 mg/dL   Glucose, Bld 92  70 - 99 mg/dL   Calcium, Ion 1.19 (*) 1.12 - 1.23 mmol/L   TCO2 25  0 - 100 mmol/L   Hemoglobin 16.0 (*) 12.0 - 15.0 g/dL   HCT 14.7 (*) 82.9 - 56.2 %      Patient be treated for cervical strain and cervical radiculopathy.  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   I personally performed the services described in this documentation, which was scribed in my  presence. The recorded information has been reviewed and is accurate.   Carlyle Dolly, PA-C 04/12/14 [phone]

## 2014-04-12 NOTE — ED Notes (Signed)
Pt reports right arm pain that started one week ago. Pt reports the pain started in the lower forearm and has progressed upward, which she describes it as stiffness and spasm. Pt is A/O x4, in NAD, and vitals are WDL. 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

## 2014-04-12 NOTE — Discharge Instructions (Signed)
Return here as needed.  Followup with her primary Dr. or an urgent care.  Use ice and heat on your neck

## 2014-04-17 NOTE — ED Provider Notes (Signed)
Medical screening examination/treatment/procedure(s) were performed by non-physician practitioner and as supervising physician I was immediately available for consultation/collaboration.   EKG Interpretation None       Jahkari Maclin, MD 04/17/14 1345 

## 2014-04-30 ENCOUNTER — Emergency Department (HOSPITAL_COMMUNITY)
Admission: EM | Admit: 2014-04-30 | Discharge: 2014-04-30 | Disposition: A | Discharge status: Home / Self Care | Payer: Self-pay | Attending: Emergency Medicine | Admitting: Emergency Medicine

## 2014-04-30 ENCOUNTER — Encounter (HOSPITAL_COMMUNITY): Payer: Self-pay | Admitting: Emergency Medicine

## 2014-04-30 ENCOUNTER — Emergency Department (HOSPITAL_COMMUNITY): Payer: Self-pay

## 2014-04-30 DIAGNOSIS — Z720 Tobacco use: Secondary | ICD-10-CM | POA: Insufficient documentation

## 2014-04-30 DIAGNOSIS — R071 Chest pain on breathing: Secondary | ICD-10-CM

## 2014-04-30 DIAGNOSIS — J159 Unspecified bacterial pneumonia: Secondary | ICD-10-CM | POA: Insufficient documentation

## 2014-04-30 DIAGNOSIS — Z7951 Long term (current) use of inhaled steroids: Secondary | ICD-10-CM | POA: Insufficient documentation

## 2014-04-30 DIAGNOSIS — J189 Pneumonia, unspecified organism: Secondary | ICD-10-CM

## 2014-04-30 DIAGNOSIS — Z880 Allergy status to penicillin: Secondary | ICD-10-CM | POA: Insufficient documentation

## 2014-04-30 DIAGNOSIS — J45901 Unspecified asthma with (acute) exacerbation: Secondary | ICD-10-CM | POA: Insufficient documentation

## 2014-04-30 DIAGNOSIS — R0789 Other chest pain: Secondary | ICD-10-CM | POA: Insufficient documentation

## 2014-04-30 DIAGNOSIS — Z79899 Other long term (current) drug therapy: Secondary | ICD-10-CM | POA: Insufficient documentation

## 2014-04-30 MED ORDER — ALBUTEROL SULFATE (2.5 MG/3ML) 0.083% IN NEBU: 2.5000 mg | INHALATION_SOLUTION | Freq: Four times a day (QID) | RESPIRATORY_TRACT | Status: DC | PRN

## 2014-04-30 MED ORDER — LEVOFLOXACIN 250 MG PO TABS: 750.0000 mg | ORAL_TABLET | Freq: Every day | ORAL | Status: DC

## 2014-04-30 MED ORDER — HYDROCODONE-ACETAMINOPHEN 5-325 MG PO TABS
2.0000 | ORAL_TABLET | Freq: Once | ORAL | Status: AC
  Administered 2014-04-30: 2 via ORAL
  Filled 2014-04-30: qty 2

## 2014-04-30 MED ORDER — HYDROCODONE-ACETAMINOPHEN 5-325 MG PO TABS: 2.0000 | ORAL_TABLET | Freq: Four times a day (QID) | ORAL | Status: DC | PRN

## 2014-04-30 MED ORDER — IBUPROFEN 200 MG PO TABS: 600.0000 mg | ORAL_TABLET | Freq: Three times a day (TID) | ORAL | Status: DC | PRN

## 2014-04-30 MED ORDER — PREDNISONE 20 MG PO TABS: 40.0000 mg | ORAL_TABLET | Freq: Every day | ORAL | Status: DC

## 2014-04-30 NOTE — ED Notes (Signed)
Pt complains of congestion and a cough for two weeks

## 2014-04-30 NOTE — ED Provider Notes (Signed)
CSN: [id number]     Arrival date & time 04/30/14  0303 History   First MD Initiated Contact with Patient 04/30/14 0454     Chief Complaint  Patient presents with  . Cough    (Consider location/radiation/quality/duration/timing/severity/associated sxs/prior Treatment) Patient is a 33 y.o. female presenting with cough. The history is provided by the patient. No language interpreter was used.  Cough Cough characteristics:  Non-productive (and congested sounding) Severity:  Moderate Onset quality:  Gradual Duration:  1 week Timing:  Intermittent Progression:  Worsening Chronicity:  New Smoker: yes   Context: not sick contacts   Relieved by:  Nothing Worsened by:  Deep breathing Ineffective treatments:  Beta-agonist inhaler, cough suppressants and decongestant Associated symptoms: chest pain, fever, rhinorrhea, shortness of breath and sinus congestion   Associated symptoms: no ear pain and no sore throat   Chest pain:    Quality:  Sharp   Severity:  Moderate   Onset quality:  Gradual   Duration:  2 days   Timing:  Intermittent (associated with coughing and deep breathing)   Progression:  Waxing and waning   Chronicity:  New Risk factors: no recent travel     Past Medical History  Diagnosis Date  . Asthma    Past Surgical History  Procedure Laterality Date  . Cesarean section    . Wisdom tooth extraction Bilateral 2007    all wisdom teeth extracted   History reviewed. No pertinent family history. History  Substance Use Topics  . Smoking status: Current Some Day Smoker -- 0.50 packs/day    Types: Cigarettes  . Smokeless tobacco: Never Used  . Alcohol Use: No   OB History   Grav Para Term Preterm Abortions TAB SAB Ect Mult Living                  Review of Systems  Constitutional: Positive for fever.  HENT: Positive for congestion and rhinorrhea. Negative for ear pain and sore throat.   Respiratory: Positive for cough and shortness of breath.   Cardiovascular:  Positive for chest pain.  Neurological: Negative for syncope.  All other systems reviewed and are negative.   Allergies  Penicillins  Home Medications   Prior to Admission medications   Medication Sig Start Date End Date Taking? Authorizing Provider  acetaminophen (TYLENOL) 500 MG tablet Take 1,000 mg by mouth every 6 (six) hours as needed for mild pain.   Yes Historical Provider, MD  albuterol (PROVENTIL HFA;VENTOLIN HFA) 108 (90 BASE) MCG/ACT inhaler Inhale 2 puffs into the lungs every 6 (six) hours as needed. For shortness of breath    Yes Historical Provider, MD  benzonatate (TESSALON) 100 MG capsule Take 100 mg by mouth 3 (three) times daily as needed for cough.   Yes Historical Provider, MD  dextromethorphan-guaiFENesin (MUCINEX DM) 30-600 MG per 12 hr tablet Take 2 tablets by mouth 2 (two) times daily as needed for cough.   Yes Historical Provider, MD  fluticasone (FLONASE) 50 MCG/ACT nasal spray Place 1 spray into both nostrils daily.   Yes Historical Provider, MD  loratadine (CLARITIN) 10 MG tablet Take 10 mg by mouth daily as needed for allergies.   Yes Historical Provider, MD  pseudoephedrine (SUDAFED) 30 MG tablet Take 1 tablet (30 mg total) by mouth every 8 (eight) hours as needed for congestion. 10/04/13  Yes Mathis Fare Presson, PA  albuterol (PROVENTIL) (2.5 MG/3ML) 0.083% nebulizer solution Take 3 mLs (2.5 mg total) by nebulization every 6 (six) hours as  needed for wheezing or shortness of breath. 04/30/14   Antony MaduraKelly Icker Swigert, PA-C  HYDROcodone-acetaminophen (NORCO/VICODIN) 5-325 MG per tablet Take 2 tablets by mouth every 6 (six) hours as needed for moderate pain or severe pain. 04/30/14   Antony MaduraKelly Tyla Burgner, PA-C  ibuprofen (ADVIL,MOTRIN) 200 MG tablet Take 3 tablets (600 mg total) by mouth every 8 (eight) hours as needed. For pain. 04/30/14   Antony MaduraKelly Harmonie Verrastro, PA-C  levofloxacin (LEVAQUIN) 250 MG tablet Take 3 tablets (750 mg total) by mouth daily. 04/30/14   Antony MaduraKelly Chayden Garrelts, PA-C  predniSONE  (DELTASONE) 20 MG tablet Take 2 tablets (40 mg total) by mouth daily. 04/30/14   Antony MaduraKelly Mehek Grega, PA-C  SUMAtriptan (IMITREX) 50 MG tablet Take 1 tablet (50 mg total) by mouth every 2 (two) hours as needed for migraine. 11/27/12   Reuben Likesavid C Keller, MD   BP 134/87  Pulse 75  Temp(Src) 98.7 F (37.1 C) (Oral)  Resp 18  Ht 5\' 5"  (1.651 m)  Wt 145 lb (65.772 kg)  BMI 24.13 kg/m2  SpO2 98%  LMP 03/22/2014  Physical Exam  Nursing note and vitals reviewed. Constitutional: She is oriented to person, place, and time. She appears well-developed and well-nourished. No distress.  Nontoxic/nonseptic appearing  HENT:  Head: Normocephalic and atraumatic.  Audible nasal congestion without rhinorrhea  Eyes: Conjunctivae and EOM are normal. No scleral icterus.  Neck: Normal range of motion.  Cardiovascular: Normal rate, regular rhythm and normal heart sounds.   Pulmonary/Chest: Effort normal. No respiratory distress. She has wheezes. She has rales (posterior LLL).  Mild wheezing in the left midlung field. No tachypnea, dyspnea, or accessory muscle use. Congested, nonproductive cough appreciated at bedside.  Musculoskeletal: Normal range of motion.  Neurological: She is alert and oriented to person, place, and time. She exhibits normal muscle tone. Coordination normal.  Skin: Skin is warm and dry. No rash noted. She is not diaphoretic. No erythema. No pallor.  Psychiatric: She has a normal mood and affect. Her behavior is normal.    ED Course  Procedures (including critical care time) Labs Review Labs Reviewed - No data to display  Imaging Review Dg Chest 2 View  04/30/2014   CLINICAL DATA:  Cough.  Initial encounter.  EXAM: CHEST  2 VIEW  COMPARISON:  10/04/2013  FINDINGS: Left lower lobe consolidation.  No effusion or cavitation.  Pectus excavatum versus remote sternal body fracture.  Normal heart size and mediastinal contours.  IMPRESSION: Left lower lobe pneumonia.   Electronically Signed   By:  Tiburcio PeaJonathan  Watts M.D.   On: 04/30/2014 04:27     EKG Interpretation None      MDM   Final diagnoses:  CAP (community acquired pneumonia)    Patient has been diagnosed with CAP via chest xray. Patient ambulates in the ED without hypoxia. Pt is not ill appearing or immunocompromised, and does not have multiple comorbidities; therefore I feel like the they can be treated as an OP with abx therapy. Will initiate tx with Levaquin 750 PO QD x 5 days. Pt has been advised to return to the ED if symptoms worsen or they do not improve. Pt verbalizes understanding and is agreeable with plan.    Filed Vitals:   04/30/14 0311 04/30/14 0548  BP: 143/68 134/87  Pulse: 113 75  Temp: 98.7 F (37.1 C)   TempSrc: Oral   Resp: 20 18  Height: 5\' 5"  (1.651 m)   Weight: 145 lb (65.772 kg)   SpO2: 99% 98%  Antony MaduraKelly Gilmer Kaminsky, PA-C 04/30/14 [phone]

## 2014-04-30 NOTE — ED Notes (Signed)
Pt ambulated with pulse ox sats at 97-98 % RA

## 2014-04-30 NOTE — Discharge Instructions (Signed)
Discontinue taking azithromycin. Take Levaquin as prescribed for 5 days. Take prednisone as prescribed to help improve your breathing. Recommend ibuprofen for pain control. You may take Norco for severe pain. Use a spirometer at least once an hour to ensure that you're taking deep breaths. Use albuterol nebulizer every 4 hours as needed for shortness of breath. Followup with your primary care doctor to ensure resolution of symptoms.  Pneumonia Pneumonia is an infection of the lungs.  CAUSES Pneumonia may be caused by bacteria or a virus. Usually, these infections are caused by breathing infectious particles into the lungs (respiratory tract). SIGNS AND SYMPTOMS   Cough.  Fever.  Chest pain.  Increased rate of breathing.  Wheezing.  Mucus production. DIAGNOSIS  If you have the common symptoms of pneumonia, your health care provider will typically confirm the diagnosis with a chest X-ray. The X-ray will show an abnormality in the lung (pulmonary infiltrate) if you have pneumonia. Other tests of your blood, urine, or sputum may be done to find the specific cause of your pneumonia. Your health care provider may also do tests (blood gases or pulse oximetry) to see how well your lungs are working. TREATMENT  Some forms of pneumonia may be spread to other people when you cough or sneeze. You may be asked to wear a mask before and during your exam. Pneumonia that is caused by bacteria is treated with antibiotic medicine. Pneumonia that is caused by the influenza virus may be treated with an antiviral medicine. Most other viral infections must run their course. These infections will not respond to antibiotics.  HOME CARE INSTRUCTIONS   Cough suppressants may be used if you are losing too much rest. However, coughing protects you by clearing your lungs. You should avoid using cough suppressants if you can.  Your health care provider may have prescribed medicine if he or she thinks your pneumonia is  caused by bacteria or influenza. Finish your medicine even if you start to feel better.  Your health care provider may also prescribe an expectorant. This loosens the mucus to be coughed up.  Take medicines only as directed by your health care provider.  Do not smoke. Smoking is a common cause of bronchitis and can contribute to pneumonia. If you are a smoker and continue to smoke, your cough may last several weeks after your pneumonia has cleared.  A cold steam vaporizer or humidifier in your room or home may help loosen mucus.  Coughing is often worse at night. Sleeping in a semi-upright position in a recliner or using a couple pillows under your head will help with this.  Get rest as you feel it is needed. Your body will usually let you know when you need to rest. PREVENTION A pneumococcal shot (vaccine) is available to prevent a common bacterial cause of pneumonia. This is usually suggested for:  People over 35 years old.  Patients on chemotherapy.  People with chronic lung problems, such as bronchitis or emphysema.  People with immune system problems. If you are over 65 or have a high risk condition, you may receive the pneumococcal vaccine if you have not received it before. In some countries, a routine influenza vaccine is also recommended. This vaccine can help prevent some cases of pneumonia.You may be offered the influenza vaccine as part of your care. If you smoke, it is time to quit. You may receive instructions on how to stop smoking. Your health care provider can provide medicines and counseling to help you  quit. SEEK MEDICAL CARE IF: You have a fever. SEEK IMMEDIATE MEDICAL CARE IF:   Your illness becomes worse. This is especially true if you are elderly or weakened from any other disease.  You cannot control your cough with suppressants and are losing sleep.  You begin coughing up blood.  You develop pain which is getting worse or is uncontrolled with  medicines.  Any of the symptoms which initially brought you in for treatment are getting worse rather than better.  You develop shortness of breath or chest pain. MAKE SURE YOU:   Understand these instructions.  Will watch your condition.  Will get help right away if you are not doing well or get worse. Document Released: 07/04/2005 Document Revised: 11/18/2013 Document Reviewed: 09/23/2010 [facility] Patient Information 2015 [place], [place]. This information is not intended to replace advice given to you by your health care provider. Make sure you discuss any questions you have with your health care provider.

## 2014-04-30 NOTE — ED Notes (Signed)
Bed: WA04 Expected date:  Expected time:  Means of arrival:  Comments: 

## 2014-05-02 NOTE — ED Provider Notes (Signed)
Medical screening examination/treatment/procedure(s) were performed by non-physician practitioner and as supervising physician I was immediately available for consultation/collaboration.   EKG Interpretation None        Chanice Brenton, MD 05/02/14 2324 

## 2014-09-02 ENCOUNTER — Ambulatory Visit (INDEPENDENT_AMBULATORY_CARE_PROVIDER_SITE_OTHER): Payer: Self-pay

## 2014-09-02 DIAGNOSIS — O3680X0 Pregnancy with inconclusive fetal viability, not applicable or unspecified: Secondary | ICD-10-CM

## 2014-09-02 DIAGNOSIS — Z01812 Encounter for preprocedural laboratory examination: Secondary | ICD-10-CM

## 2014-09-02 LAB — POCT PREGNANCY, URINE: PREG TEST UR: POSITIVE — AB

## 2014-09-02 NOTE — Progress Notes (Signed)
Pt came to front desk and stated that she has relapsed into taking pain medication from a ER visit a month ago.  Pt stated that she has now found out that she is pregnant and she is very concerned about her baby.  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  I explained to the patient that she needs to start prenatal care in which we can get her an appt her at our office.  I also explained to pt that it is against HIPPA violation that any health care provider goes through her chart.  And that there is patient confidentiality when it comes to patients and them seeing a provider.  Pt stated understanding.  I informed pt that she needs to continue to go and get her Subutex daily so that she is  not withdrawal "cold [place]".  Pregnancy test done today- resulted positive.  US scheduled for 09/04/14 @ 130 pm.  New OB appt scheduled for 09/08/14.  Pt stated that she wanted to wait to get her labs drawn on her New OB appt. I informed pt that she would a receive a tour of the NICU so that she will know what to possibly expect. Pt again stated understanding.

## 2014-09-04 ENCOUNTER — Ambulatory Visit (HOSPITAL_COMMUNITY)
Admission: RE | Admit: 2014-09-04 | Discharge: 2014-09-04 | Disposition: A | Discharge status: Home / Self Care | Payer: Medicaid Other | Source: Ambulatory Visit | Attending: Obstetrics and Gynecology | Admitting: Obstetrics and Gynecology

## 2014-09-04 DIAGNOSIS — Z36 Encounter for antenatal screening of mother: Secondary | ICD-10-CM | POA: Insufficient documentation

## 2014-09-04 DIAGNOSIS — Z3A10 10 weeks gestation of pregnancy: Secondary | ICD-10-CM | POA: Diagnosis not present

## 2014-09-04 DIAGNOSIS — O3680X0 Pregnancy with inconclusive fetal viability, not applicable or unspecified: Secondary | ICD-10-CM

## 2014-09-15 ENCOUNTER — Encounter: Payer: Self-pay | Admitting: Family Medicine

## 2014-09-15 ENCOUNTER — Encounter: Payer: Medicaid Other | Admitting: Family Medicine

## 2014-09-16 ENCOUNTER — Encounter: Payer: Self-pay | Admitting: General Practice

## 2014-10-20 ENCOUNTER — Other Ambulatory Visit: Payer: Self-pay | Admitting: Obstetrics & Gynecology

## 2014-10-20 LAB — OB RESULTS CONSOLE GC/CHLAMYDIA
Chlamydia: NEGATIVE
GC PROBE AMP, GENITAL: NEGATIVE

## 2014-10-21 LAB — CYTOLOGY - PAP

## 2014-10-24 ENCOUNTER — Other Ambulatory Visit: Payer: Self-pay | Admitting: Obstetrics & Gynecology

## 2014-10-24 DIAGNOSIS — N6322 Unspecified lump in the left breast, upper inner quadrant: Secondary | ICD-10-CM

## 2014-10-27 LAB — OB RESULTS CONSOLE HGB/HCT, BLOOD: Hemoglobin: 11.4 g/dL

## 2014-10-27 LAB — OB RESULTS CONSOLE RUBELLA ANTIBODY, IGM: Rubella: IMMUNE

## 2014-10-28 ENCOUNTER — Ambulatory Visit
Admission: RE | Admit: 2014-10-28 | Discharge: 2014-10-28 | Disposition: A | Discharge status: Home / Self Care | Payer: Medicaid Other | Source: Ambulatory Visit | Attending: Obstetrics & Gynecology | Admitting: Obstetrics & Gynecology

## 2014-10-28 DIAGNOSIS — N6322 Unspecified lump in the left breast, upper inner quadrant: Secondary | ICD-10-CM

## 2015-02-16 ENCOUNTER — Other Ambulatory Visit: Payer: Self-pay | Admitting: Obstetrics & Gynecology

## 2015-02-20 ENCOUNTER — Other Ambulatory Visit: Payer: Self-pay

## 2015-02-24 ENCOUNTER — Inpatient Hospital Stay (HOSPITAL_COMMUNITY)
Admission: AD | Admit: 2015-02-24 | Discharge: 2015-02-28 | DRG: 765 | Disposition: A | Discharge status: Home / Self Care | Payer: Medicaid Other | Source: Ambulatory Visit | Attending: Obstetrics and Gynecology | Admitting: Obstetrics and Gynecology

## 2015-02-24 ENCOUNTER — Encounter (HOSPITAL_COMMUNITY): Payer: Self-pay | Admitting: *Deleted

## 2015-02-24 ENCOUNTER — Inpatient Hospital Stay (HOSPITAL_COMMUNITY): Payer: Medicaid Other

## 2015-02-24 DIAGNOSIS — O163 Unspecified maternal hypertension, third trimester: Secondary | ICD-10-CM

## 2015-02-24 DIAGNOSIS — Z302 Encounter for sterilization: Secondary | ICD-10-CM

## 2015-02-24 DIAGNOSIS — O34219 Maternal care for unspecified type scar from previous cesarean delivery: Secondary | ICD-10-CM

## 2015-02-24 DIAGNOSIS — F1110 Opioid abuse, uncomplicated: Secondary | ICD-10-CM | POA: Diagnosis present

## 2015-02-24 DIAGNOSIS — O99324 Drug use complicating childbirth: Secondary | ICD-10-CM | POA: Diagnosis present

## 2015-02-24 DIAGNOSIS — O3421 Maternal care for scar from previous cesarean delivery: Secondary | ICD-10-CM | POA: Diagnosis present

## 2015-02-24 DIAGNOSIS — O4593 Premature separation of placenta, unspecified, third trimester: Principal | ICD-10-CM

## 2015-02-24 DIAGNOSIS — O99334 Smoking (tobacco) complicating childbirth: Secondary | ICD-10-CM | POA: Diagnosis present

## 2015-02-24 DIAGNOSIS — O4693 Antepartum hemorrhage, unspecified, third trimester: Secondary | ICD-10-CM

## 2015-02-24 DIAGNOSIS — O09299 Supervision of pregnancy with other poor reproductive or obstetric history, unspecified trimester: Secondary | ICD-10-CM

## 2015-02-24 DIAGNOSIS — Z3A35 35 weeks gestation of pregnancy: Secondary | ICD-10-CM

## 2015-02-24 DIAGNOSIS — O4690 Antepartum hemorrhage, unspecified, unspecified trimester: Secondary | ICD-10-CM | POA: Diagnosis present

## 2015-02-24 DIAGNOSIS — F17210 Nicotine dependence, cigarettes, uncomplicated: Secondary | ICD-10-CM

## 2015-02-24 DIAGNOSIS — O09523 Supervision of elderly multigravida, third trimester: Secondary | ICD-10-CM

## 2015-02-24 LAB — RAPID URINE DRUG SCREEN, HOSP PERFORMED
Amphetamines: NOT DETECTED
BARBITURATES: NOT DETECTED
Benzodiazepines: NOT DETECTED
COCAINE: NOT DETECTED
OPIATES: NOT DETECTED
Tetrahydrocannabinol: NOT DETECTED

## 2015-02-24 LAB — COMPREHENSIVE METABOLIC PANEL
ALK PHOS: 195 U/L — AB (ref 38–126)
ALT: 14 U/L (ref 14–54)
AST: 21 U/L (ref 15–41)
Albumin: 2.1 g/dL — ABNORMAL LOW (ref 3.5–5.0)
Anion gap: 8 (ref 5–15)
BUN: 15 mg/dL (ref 6–20)
CO2: 21 mmol/L — ABNORMAL LOW (ref 22–32)
Calcium: 8.8 mg/dL — ABNORMAL LOW (ref 8.9–10.3)
Chloride: 105 mmol/L (ref 101–111)
Creatinine, Ser: 0.57 mg/dL (ref 0.44–1.00)
GFR calc Af Amer: >60 mL/min (ref >60)
GLUCOSE: 84 mg/dL (ref 65–99)
POTASSIUM: 4.6 mmol/L (ref 3.5–5.1)
SODIUM: 134 mmol/L — AB (ref 135–145)
TOTAL PROTEIN: 5.8 g/dL — AB (ref 6.5–8.1)
Total Bilirubin: 0.3 mg/dL (ref 0.3–1.2)

## 2015-02-24 LAB — URINALYSIS, ROUTINE W REFLEX MICROSCOPIC
BILIRUBIN URINE: NEGATIVE
Glucose, UA: NEGATIVE mg/dL
KETONES UR: NEGATIVE mg/dL
Leukocytes, UA: NEGATIVE
Nitrite: NEGATIVE
Protein, ur: NEGATIVE mg/dL
Specific Gravity, Urine: 1.01 (ref 1.005–1.030)
Urobilinogen, UA: 0.2 mg/dL (ref 0.0–1.0)
pH: 7 (ref 5.0–8.0)

## 2015-02-24 LAB — CBC
HEMATOCRIT: 35 % — AB (ref 36.0–46.0)
Hemoglobin: 11.4 g/dL — ABNORMAL LOW (ref 12.0–15.0)
MCH: 30 pg (ref 26.0–34.0)
MCHC: 32.6 g/dL (ref 30.0–36.0)
MCV: 92.1 fL (ref 78.0–100.0)
Platelets: 333 10*3/uL (ref 150–400)
RBC: 3.8 MIL/uL — ABNORMAL LOW (ref 3.87–5.11)
RDW: 13.7 % (ref 11.5–15.5)
WBC: 14.2 10*3/uL — AB (ref 4.0–10.5)

## 2015-02-24 LAB — URINE MICROSCOPIC-ADD ON

## 2015-02-24 LAB — PROTEIN / CREATININE RATIO, URINE
Creatinine, Urine: 172 mg/dL
Protein Creatinine Ratio: 0.12 mg/mg{Cre} (ref 0.00–0.15)
Total Protein, Urine: 20 mg/dL

## 2015-02-24 LAB — ABO/RH: ABO/RH(D): O POS

## 2015-02-24 MED ORDER — BETAMETHASONE SOD PHOS & ACET 6 (3-3) MG/ML IJ SUSP
12.0000 mg | Freq: Every day | INTRAMUSCULAR | Status: DC
  Administered 2015-02-24: 12 mg via INTRAMUSCULAR
  Filled 2015-02-24 (×2): qty 2

## 2015-02-24 MED ORDER — ZOLPIDEM TARTRATE 5 MG PO TABS
5.0000 mg | ORAL_TABLET | Freq: Every evening | ORAL | Status: DC | PRN
  Administered 2015-02-25: 5 mg via ORAL
  Filled 2015-02-24: qty 1

## 2015-02-24 MED ORDER — PRENATAL MULTIVITAMIN CH
1.0000 | ORAL_TABLET | Freq: Every day | ORAL | Status: DC
  Administered 2015-02-24: 1 via ORAL
  Filled 2015-02-24: qty 1

## 2015-02-24 MED ORDER — BUPRENORPHINE HCL 8 MG SL SUBL: 12.0000 mg | SUBLINGUAL_TABLET | Freq: Two times a day (BID) | SUBLINGUAL | Status: DC

## 2015-02-24 MED ORDER — CALCIUM CARBONATE ANTACID 500 MG PO CHEW
2.0000 | CHEWABLE_TABLET | ORAL | Status: DC | PRN
  Administered 2015-02-24: 400 mg via ORAL
  Filled 2015-02-24: qty 2

## 2015-02-24 MED ORDER — ACETAMINOPHEN 325 MG PO TABS
650.0000 mg | ORAL_TABLET | ORAL | Status: DC | PRN
  Administered 2015-02-24 – 2015-02-25 (×3): 650 mg via ORAL
  Filled 2015-02-24 (×3): qty 2

## 2015-02-24 MED ORDER — BUPRENORPHINE HCL 8 MG SL SUBL
10.0000 mg | SUBLINGUAL_TABLET | Freq: Once | SUBLINGUAL | Status: AC
  Administered 2015-02-24: 10 mg via SUBLINGUAL

## 2015-02-24 MED ORDER — DOCUSATE SODIUM 100 MG PO CAPS
100.0000 mg | ORAL_CAPSULE | Freq: Every day | ORAL | Status: DC
  Administered 2015-02-24: 100 mg via ORAL
  Filled 2015-02-24: qty 1

## 2015-02-24 NOTE — MAU Provider Note (Signed)
History     CSN: [id number]  Arrival date and time: 02/24/15 4098   First Provider Initiated Contact with Patient 02/24/15 [phone]      No chief complaint on file.  HPI Comments: Rebecca Meyers is a 34 y.o. [phone] at [redacted]w[redacted]d who presents today with vaginal bleeding. She states that at 0430 she "woke up in a puddle of blood". She denies any abdominal pain. She states that the fetus has been active. She states that she is on suboxone, and has been followed for elevated blood pressures during this pregnancy . Patient states that she is scheduled for a repeat c-section on 03/17/15.   Vaginal Bleeding The patient's primary symptoms include vaginal bleeding. This is a new problem. The current episode started today. The problem occurs constantly. The problem has been unchanged. The patient is experiencing no pain. She is pregnant. Pertinent negatives include no abdominal pain, fever, headaches, nausea or vomiting. The vaginal discharge was bloody. The vaginal bleeding is typical of menses. She has not been passing clots. She has not been passing tissue. Nothing aggravates the symptoms. She has tried nothing for the symptoms.     Past Medical History  Diagnosis Date  . Asthma     Past Surgical History  Procedure Laterality Date  . Cesarean section    . Wisdom tooth extraction Bilateral 2007    all wisdom teeth extracted    History reviewed. No pertinent family history.  History  Substance Use Topics  . Smoking status: Current Some Day Smoker -- 0.50 packs/day    Types: Cigarettes  . Smokeless tobacco: Never Used  . Alcohol Use: No    Allergies:  Allergies  Allergen Reactions  . Penicillins Hives    Prescriptions prior to admission  Medication Sig Dispense Refill Last Dose  . acetaminophen (TYLENOL) 500 MG tablet Take 1,000 mg by mouth every 6 (six) hours as needed for mild pain.   Past Month at Unknown time  . buprenorphine (SUBUTEX) 8 MG SUBL SL tablet Place 24 mg under the  tongue daily.   02/23/2015 at Unknown time  . albuterol (PROVENTIL HFA;VENTOLIN HFA) 108 (90 BASE) MCG/ACT inhaler Inhale 2 puffs into the lungs every 6 (six) hours as needed. For shortness of breath    More than a month at Unknown time  . albuterol (PROVENTIL) (2.5 MG/3ML) 0.083% nebulizer solution Take 3 mLs (2.5 mg total) by nebulization every 6 (six) hours as needed for wheezing or shortness of breath. 75 mL 0 More than a month at Unknown time  . benzonatate (TESSALON) 100 MG capsule Take 100 mg by mouth 3 (three) times daily as needed for cough.   Past Week at Unknown time  . dextromethorphan-guaiFENesin (MUCINEX DM) 30-600 MG per 12 hr tablet Take 2 tablets by mouth 2 (two) times daily as needed for cough.   04/29/2014 at Unknown time  . fluticasone (FLONASE) 50 MCG/ACT nasal spray Place 1 spray into both nostrils daily.   04/29/2014 at Unknown time  . HYDROcodone-acetaminophen (NORCO/VICODIN) 5-325 MG per tablet Take 2 tablets by mouth every 6 (six) hours as needed for moderate pain or severe pain. 11 tablet 0   . ibuprofen (ADVIL,MOTRIN) 200 MG tablet Take 3 tablets (600 mg total) by mouth every 8 (eight) hours as needed. For pain. 30 tablet 0   . levofloxacin (LEVAQUIN) 250 MG tablet Take 3 tablets (750 mg total) by mouth daily. 15 tablet 0   . loratadine (CLARITIN) 10 MG tablet Take 10 mg by  mouth daily as needed for allergies.   04/29/2014 at Unknown time  . predniSONE (DELTASONE) 20 MG tablet Take 2 tablets (40 mg total) by mouth daily. 10 tablet 0   . pseudoephedrine (SUDAFED) 30 MG tablet Take 1 tablet (30 mg total) by mouth every 8 (eight) hours as needed for congestion. 30 tablet 0 Past Week at Unknown time  . SUMAtriptan (IMITREX) 50 MG tablet Take 1 tablet (50 mg total) by mouth every 2 (two) hours as needed for migraine. 10 tablet 0 unknown    Review of Systems  Constitutional: Negative for fever.  Eyes: Negative for blurred vision.  Gastrointestinal: Negative for nausea, vomiting  and abdominal pain.  Genitourinary: Positive for vaginal bleeding.  Neurological: Negative for headaches.   Physical Exam   Blood pressure 166/95, pulse 62, temperature 98.2 F (36.8 C), temperature source Oral, resp. rate 20, height  (1.651 m), weight 97.523 kg (215 lb), last menstrual period 03/22/2014, SpO2 97 %.  Physical Exam  Nursing note and vitals reviewed. Constitutional: She is oriented to person, place, and time. She appears well-developed and well-nourished. No distress.  HENT:  Head: Normocephalic.  Cardiovascular: Normal rate.   Respiratory: Effort normal.  GI: Soft. There is no tenderness.  Genitourinary:   External: no lesion Vagina: small amount of mucousy blood in the vagina.  Cervix: pink, smooth, closed/thick/high  Uterus: AGA    Neurological: She is alert and oriented to person, place, and time. She has normal reflexes.  No clonus   Skin: Skin is warm and dry.  Psychiatric: She has a normal mood and affect.   FHT: 135, moderate no accels, no decels Toco: irregular UCs  [place]: BPP 8/8 SCH noted on the right of the placenta 7.5x 1.0x 3.1 cm   Results for orders placed or performed during the hospital encounter of 02/24/15 (from the past 24 hour(s))  Urinalysis, Routine w reflex microscopic (not at [facility])     Status: Abnormal   Collection Time: 02/24/15  5:18 AM  Result Value Ref Range   Color, Urine YELLOW YELLOW   APPearance CLEAR CLEAR   Specific Gravity, Urine 1.010 1.005 - 1.030   pH 7.0 5.0 - 8.0   Glucose, UA NEGATIVE NEGATIVE mg/dL   Hgb urine dipstick LARGE (A) NEGATIVE   Bilirubin Urine NEGATIVE NEGATIVE   Ketones, ur NEGATIVE NEGATIVE mg/dL   Protein, ur NEGATIVE NEGATIVE mg/dL   Urobilinogen, UA 0.2 0.0 - 1.0 mg/dL   Nitrite NEGATIVE NEGATIVE   Leukocytes, UA NEGATIVE NEGATIVE  Urine rapid drug screen (hosp performed)     Status: None   Collection Time: 02/24/15  5:18 AM  Result Value Ref Range   Opiates NONE DETECTED NONE DETECTED    Cocaine NONE DETECTED NONE DETECTED   Benzodiazepines NONE DETECTED NONE DETECTED   Amphetamines NONE DETECTED NONE DETECTED   Tetrahydrocannabinol NONE DETECTED NONE DETECTED   Barbiturates NONE DETECTED NONE DETECTED  Urine microscopic-add on     Status: Abnormal   Collection Time: 02/24/15  5:18 AM  Result Value Ref Range   Squamous Epithelial / LPF FEW (A) RARE   WBC, UA 0-2 <3 WBC/hpf   RBC / HPF 7-10 <3 RBC/hpf   Bacteria, UA FEW (A) RARE  CBC     Status: Abnormal   Collection Time: 02/24/15  5:30 AM  Result Value Ref Range   WBC 14.2 (H) 4.0 - 10.5 K/uL   RBC 3.80 (L) 3.87 - 5.11 MIL/uL   Hemoglobin 11.4 (L) 12.0 -  15.0 g/dL   HCT 16.1 (L) 09.6 - 04.5 %   MCV 92.1 78.0 - 100.0 fL   MCH 30.0 26.0 - 34.0 pg   MCHC 32.6 30.0 - 36.0 g/dL   RDW 40.9 81.1 - 91.4 %   Platelets 333 150 - 400 K/uL  Comprehensive metabolic panel     Status: Abnormal   Collection Time: 02/24/15  5:30 AM  Result Value Ref Range   Sodium 134 (L) 135 - 145 mmol/L   Potassium 4.6 3.5 - 5.1 mmol/L   Chloride 105 101 - 111 mmol/L   CO2 21 (L) 22 - 32 mmol/L   Glucose, Bld 84 65 - 99 mg/dL   BUN 15 6 - 20 mg/dL   Creatinine, Ser 7.82 0.44 - 1.00 mg/dL   Calcium 8.8 (L) 8.9 - 10.3 mg/dL   Total Protein 5.8 (L) 6.5 - 8.1 g/dL   Albumin 2.1 (L) 3.5 - 5.0 g/dL   AST 21 15 - 41 U/L   ALT 14 14 - 54 U/L   Alkaline Phosphatase 195 (H) 38 - 126 U/L   Total Bilirubin 0.3 0.3 - 1.2 mg/dL   GFR calc non Af Amer >60 >60 mL/min   GFR calc Af Amer >60 >60 mL/min   Anion gap 8 5 - 15    MAU Course  Procedures  MDM 9562: D/W Dr. Dareen Piano, he will consult with MFM for plan.  1308: Dr. Dareen Piano called the unit. He will admit the patient to antenatal at this time.   Assessment and Plan  Vaginal bleeding in 3rd trimester  Subchorionic hemorrhage Admit to antenatal   Tawnya Crook 02/24/2015, 5:21 AM

## 2015-02-24 NOTE — MAU Note (Signed)
Pt states has been feeling crampy the past week, increasing discomfort now

## 2015-02-24 NOTE — Progress Notes (Signed)
Pt was up in room standing and talking on the phone.  She was encouraged to stay in bed.

## 2015-02-24 NOTE — MAU Note (Signed)
Pt presents with complaint of vaginal bleeding that started at 0400, denies pain.

## 2015-02-24 NOTE — Plan of Care (Signed)
Problem: Consults Goal: Birthing Suites Patient Information Press F2 to bring up selections list  Outcome: Completed/Met Date Met:  02/24/15  Pt 37-[redacted] weeks EGA

## 2015-02-24 NOTE — Progress Notes (Signed)
Pt had taken herself off EFM and was in the shower. Pt instructed that she was on continuous monitoring and needed to be back in bed. Pt agreed.

## 2015-02-24 NOTE — Progress Notes (Signed)
Encouraged patient to continue bedrest.

## 2015-02-24 NOTE — H&P (Signed)
34 y.o. [redacted]w[redacted]d  [name] comes in c/o heavy vaginal bleeding starting at 4:30am, states woke up in a pool of blood, reports cramping since last week. Otherwise has good fetal movement.  Past Medical History  Diagnosis Date  . Asthma     allergy related    Past Surgical History  Procedure Laterality Date  . Cesarean section    . Wisdom tooth extraction Bilateral 2007    all wisdom teeth extracted    OB History  Gravida Para Term Preterm AB SAB TAB Ectopic Multiple Living     # Outcome Date GA Lbr Len/2nd Weight Sex Delivery Anes PTL Lv  3 Current           2 TAB           1 Preterm      CS-LTranv       Obstetric Comments  Patient on buprenorphine     History   Social History  . Marital Status: Single    Spouse Name: N/A  . Number of Children: N/A  . Years of Education: N/A   Occupational History  . Not on file.   Social History Main Topics  . Smoking status: Current Some Day Smoker -- 0.50 packs/day    Types: Cigarettes  . Smokeless tobacco: Never Used  . Alcohol Use: No  . Drug Use: No  . Sexual Activity: Yes    Birth Control/ Protection: None   Other Topics Concern  . 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  H/o 35 delivery for severe PEC.  Possible CHTN on no meds prior to pregnancy    Filed Vitals:   02/24/15 1319  BP: 130/65  Pulse: 53  Temp: 98.1 F (36.7 C)  Resp: 18     Lungs/Cor:  NAD Abdomen:  soft, gravid Ex:  no cords,  erythema FHTs: 135, good STV, NST R SVE: small amount of VB   A/P   Admitted overnight by MA with abruption  US showing 7.5 x 1 x 3.1 cm blood collection c/w SCH-abruption  Spoke with MFM Dr. Claudean Severance who saw patient, he recommends late preterm steroids for 48 hrs and repeat c/s to be performed thurs, as patient is currently stable with no further bleeding. Dr. Mora Appl will perform at 1:15pm.  Continue Subutex - same dosage  Continuous FHR monitoring  Check urine Prot:Cr ration by straight cath  Consider earlier c/s for bleeding, NRFHT or persistent severe range BPs or symptoms.  GBS Not done Type and screen active, Hb 11.4  Rebecca Meyers

## 2015-02-24 NOTE — Consult Note (Signed)
Maternal Fetal Medicine Consultation  Requesting Provider(s): Malva Limes, MD  Primary OB: [facility]  Reason for consultation: IUP at [redacted]w[redacted]d, suspected placental abruption  HPI: Rebecca Meyers is a 34 yo G2P0101, EDD 03/27/2015, currently at 35w 4d admitted due to vaginal bleeding and suspected placental abruption.  Rebecca Meyers reports the onset of bleeding "like I was starting my period" at about 0400 hrs this morning.  She also reports some lower abdominal cramping since that time.  Ultrasound on admission showed a rim of subchorionic fluid consistent with placenta abruption.  Blood pressures on admission were elevated (160-170/90-100) but rapidly improved without medications.  Her vaginal bleeding has also subsided since admission.  Admission BPP was 8/8 and the fetal tracing has been reassuring since admission.  Initial preeclampsia labs were within normal limits - urine p/c ratio is pending.  (had a 24-hr urine protein of 242 mg/24 hrs on 28 July).  Rebecca Meyers prenatal course has been complicated by a smoking history (reports that she is currently smoking 1/2 ppd), Subutex use and chronic hypertension.  Her past OB history is remarkable for a prior C-section at 36 weeks due to preeclampsia.  She plans on having a repeat C-section this pregnancy.  OB History: OB History    Gravida Para Term Preterm AB TAB SAB Ectopic Multiple Living   3 1 0 1 1 1 0 0 0 1       Obstetric Comments   Patient on buprenorphine       PMH:  Past Medical History  Diagnosis Date  . Asthma     allergy related    PSH:  Past Surgical History  Procedure Laterality Date  . Cesarean section    . Wisdom tooth extraction Bilateral 2007    all wisdom teeth extracted   Meds:  No current facility-administered medications on file prior to encounter.   Current Outpatient Prescriptions on File Prior to Encounter  Medication Sig Dispense Refill  . acetaminophen (TYLENOL) 500 MG tablet Take 1,000 mg by  mouth every 6 (six) hours as needed for mild pain.    Marland Kitchen albuterol (PROVENTIL HFA;VENTOLIN HFA) 108 (90 BASE) MCG/ACT inhaler Inhale 2 puffs into the lungs every 6 (six) hours as needed. For shortness of breath     . albuterol (PROVENTIL) (2.5 MG/3ML) 0.083% nebulizer solution Take 3 mLs (2.5 mg total) by nebulization every 6 (six) hours as needed for wheezing or shortness of breath. 75 mL 0    Allergies:  Allergies  Allergen Reactions  . Penicillins Hives   FH:  Family History  Problem Relation Age of Onset  . Hypertension Father   . Diabetes Maternal Grandmother   . Heart disease Maternal Grandmother   . Cancer Maternal Grandfather   . Cancer Paternal Grandmother   . Diabetes Paternal Grandfather   . Heart disease Paternal Grandfather   . Stroke Paternal Grandfather     Soc:  History   Social History  . Marital Status: Single    Spouse Name: N/A  . Number of Children: N/A  . Years of Education: N/A   Occupational History  . Not on file.   Social History Main Topics  . Smoking status: Current Some Day Smoker -- 0.50 packs/day    Types: Cigarettes  . Smokeless tobacco: Never Used  . Alcohol Use: No  . Drug Use: No  . Sexual Activity: Yes    Birth Control/ Protection: None   Other Topics Concern  . Not on file  Social History Narrative    Review of Systems: no vaginal bleeding or cramping/contractions, no LOF, no nausea/vomiting. All other systems reviewed and are negative.  PNL:   PE:   Filed Vitals:   02/24/15 0901  BP: 135/75  Pulse: 57  Temp: 98.2 F (36.8 C)  Resp:     GEN: well-appearing female ABD: gravid, NT  Please see separate document for fetal ultrasound report.  Labs: CBC    Component Value Date/Time   WBC 14.2* 02/24/2015 0530   RBC 3.80* 02/24/2015 0530   HGB 11.4* 02/24/2015 0530   HCT 35.0* 02/24/2015 0530   PLT 333 02/24/2015 0530   MCV 92.1 02/24/2015 0530   MCH 30.0 02/24/2015 0530   MCHC 32.6 02/24/2015 0530   RDW  13.7 02/24/2015 0530   CMP     Component Value Date/Time   NA 134* 02/24/2015 0530   K 4.6 02/24/2015 0530   CL 105 02/24/2015 0530   CO2 21* 02/24/2015 0530   GLUCOSE 84 02/24/2015 0530   BUN 15 02/24/2015 0530   CREATININE 0.57 02/24/2015 0530   CALCIUM 8.8* 02/24/2015 0530   PROT 5.8* 02/24/2015 0530   ALBUMIN 2.1* 02/24/2015 0530   AST 21 02/24/2015 0530   ALT 14 02/24/2015 0530   ALKPHOS 195* 02/24/2015 0530   BILITOT 0.3 02/24/2015 0530   GFRNONAA >60 02/24/2015 0530   GFRAA >60 02/24/2015 0530    A/P: 1) Single IUP at 35w 4d  2) Vaginal bleeding, suspected marginal abruption - ultrasound shows a subchorionic fluid collection consistent with abruption.  Since admission, the patient's vaginal bleeding has improved and the BPP and fetal tracing have been reassuring.  At this time, do not feel that emergent delivery is indicated.  Recommendations: 1) Would give a course of late preterm betamethasone - 12 mg now and repeat x 1 dose in 24 hours with plans for repeat C-section once betamethasone is complete. 2) In the meantime, recommend continuous fetal monitoring.  If vaginal bleeding worsens or the fetal tracing becomes concerning, would recommend immediate delivery. 3) Please check a urine P/C ratio. If the patient rules in for preeclampsia (particularly since she did have some severe range blood pressures on admission) would begin Magnesium sulfate prophylaxis and proceed with delivery now and would not await completion of the betamethasone series.    Recommendations discussed with Dr. Claiborne Billings.  Thank you for the opportunity to be a part of the care of FREDDA Meyers. Please contact our office if we can be of further assistance.   I spent approximately 30 minutes with this patient with over 50% of time spent in face-to-face counseling.  Alpha Gula, MD Maternal Fetal Medicine

## 2015-02-25 ENCOUNTER — Observation Stay (HOSPITAL_COMMUNITY): Payer: Medicaid Other | Admitting: Anesthesiology

## 2015-02-25 ENCOUNTER — Encounter (HOSPITAL_COMMUNITY): Payer: Self-pay | Admitting: Obstetrics

## 2015-02-25 ENCOUNTER — Encounter (HOSPITAL_COMMUNITY): Payer: Self-pay | Admitting: Anesthesiology

## 2015-02-25 ENCOUNTER — Encounter (HOSPITAL_COMMUNITY)
Admission: AD | Disposition: A | Discharge status: Home / Self Care | Payer: Self-pay | Source: Ambulatory Visit | Attending: Obstetrics and Gynecology

## 2015-02-25 DIAGNOSIS — Z302 Encounter for sterilization: Secondary | ICD-10-CM | POA: Diagnosis not present

## 2015-02-25 DIAGNOSIS — O4593 Premature separation of placenta, unspecified, third trimester: Secondary | ICD-10-CM | POA: Diagnosis not present

## 2015-02-25 DIAGNOSIS — F17210 Nicotine dependence, cigarettes, uncomplicated: Secondary | ICD-10-CM | POA: Diagnosis not present

## 2015-02-25 DIAGNOSIS — O3421 Maternal care for scar from previous cesarean delivery: Secondary | ICD-10-CM | POA: Diagnosis present

## 2015-02-25 DIAGNOSIS — O4693 Antepartum hemorrhage, unspecified, third trimester: Secondary | ICD-10-CM | POA: Diagnosis present

## 2015-02-25 DIAGNOSIS — Z3A35 35 weeks gestation of pregnancy: Secondary | ICD-10-CM | POA: Diagnosis present

## 2015-02-25 DIAGNOSIS — F1110 Opioid abuse, uncomplicated: Secondary | ICD-10-CM | POA: Diagnosis present

## 2015-02-25 DIAGNOSIS — O99324 Drug use complicating childbirth: Secondary | ICD-10-CM | POA: Diagnosis not present

## 2015-02-25 DIAGNOSIS — O99334 Smoking (tobacco) complicating childbirth: Secondary | ICD-10-CM | POA: Diagnosis present

## 2015-02-25 LAB — CBC
HCT: 28.5 % — ABNORMAL LOW (ref 36.0–46.0)
Hemoglobin: 9.4 g/dL — ABNORMAL LOW (ref 12.0–15.0)
MCH: 30.6 pg (ref 26.0–34.0)
MCHC: 33 g/dL (ref 30.0–36.0)
MCV: 92.8 fL (ref 78.0–100.0)
Platelets: 271 10*3/uL (ref 150–400)
RBC: 3.07 MIL/uL — ABNORMAL LOW (ref 3.87–5.11)
RDW: 13.9 % (ref 11.5–15.5)
WBC: 17.7 10*3/uL — ABNORMAL HIGH (ref 4.0–10.5)

## 2015-02-25 LAB — PREPARE RBC (CROSSMATCH)

## 2015-02-25 SURGERY — Surgical Case
Anesthesia: Spinal | Site: Abdomen

## 2015-02-25 MED ORDER — OXYCODONE HCL 5 MG PO TABS
10.0000 mg | ORAL_TABLET | ORAL | Status: DC | PRN
  Administered 2015-02-25 – 2015-02-28 (×12): 10 mg via ORAL
  Filled 2015-02-25 (×15): qty 2

## 2015-02-25 MED ORDER — DIPHENHYDRAMINE HCL 25 MG PO CAPS
25.0000 mg | ORAL_CAPSULE | ORAL | Status: DC | PRN
Start: 2015-02-25 — End: 2015-02-28

## 2015-02-25 MED ORDER — HYDROMORPHONE HCL 1 MG/ML IJ SOLN
INTRAMUSCULAR | Status: AC
  Administered 2015-02-25: 1 mg via INTRAVENOUS
  Filled 2015-02-25: qty 1

## 2015-02-25 MED ORDER — MENTHOL 3 MG MT LOZG: 1.0000 | LOZENGE | OROMUCOSAL | Status: DC | PRN

## 2015-02-25 MED ORDER — MIDAZOLAM HCL 2 MG/2ML IJ SOLN
INTRAMUSCULAR | Status: AC
  Filled 2015-02-25: qty 4

## 2015-02-25 MED ORDER — FENTANYL CITRATE (PF) 100 MCG/2ML IJ SOLN
INTRAMUSCULAR | Status: AC
  Filled 2015-02-25: qty 2

## 2015-02-25 MED ORDER — FENTANYL CITRATE (PF) 100 MCG/2ML IJ SOLN
25.0000 ug | INTRAMUSCULAR | Status: DC | PRN
  Administered 2015-02-25: 50 ug via INTRAVENOUS

## 2015-02-25 MED ORDER — TETANUS-DIPHTH-ACELL PERTUSSIS 5-2.5-18.5 LF-MCG/0.5 IM SUSP
0.5000 mL | Freq: Once | INTRAMUSCULAR | Status: AC
  Administered 2015-02-27: 0.5 mL via INTRAMUSCULAR
  Filled 2015-02-25: qty 0.5

## 2015-02-25 MED ORDER — SODIUM CHLORIDE 0.9 % IJ SOLN: 3.0000 mL | INTRAMUSCULAR | Status: DC | PRN

## 2015-02-25 MED ORDER — LACTATED RINGERS IV SOLN
INTRAVENOUS | Status: DC | PRN
Start: 2015-02-25 — End: 2015-02-25
  Administered 2015-02-25 (×3): via INTRAVENOUS

## 2015-02-25 MED ORDER — SIMETHICONE 80 MG PO CHEW
80.0000 mg | CHEWABLE_TABLET | ORAL | Status: DC
Start: 2015-02-26 — End: 2015-02-28
  Administered 2015-02-26 – 2015-02-27 (×3): 80 mg via ORAL
  Filled 2015-02-25 (×3): qty 1

## 2015-02-25 MED ORDER — NALBUPHINE HCL 10 MG/ML IJ SOLN: 5.0000 mg | Freq: Once | INTRAMUSCULAR | Status: DC | PRN

## 2015-02-25 MED ORDER — BUPRENORPHINE HCL 8 MG SL SUBL
14.0000 mg | SUBLINGUAL_TABLET | Freq: Once | SUBLINGUAL | Status: AC
  Administered 2015-02-25: 14 mg via SUBLINGUAL

## 2015-02-25 MED ORDER — MORPHINE SULFATE 0.5 MG/ML IJ SOLN
INTRAMUSCULAR | Status: AC
  Filled 2015-02-25: qty 100

## 2015-02-25 MED ORDER — BUPRENORPHINE HCL 8 MG SL SUBL
10.0000 mg | SUBLINGUAL_TABLET | Freq: Every day | SUBLINGUAL | Status: DC
  Administered 2015-02-26 – 2015-02-28 (×3): 10 mg via SUBLINGUAL
  Filled 2015-02-25 (×5): qty 1

## 2015-02-25 MED ORDER — SIMETHICONE 80 MG PO CHEW
80.0000 mg | CHEWABLE_TABLET | ORAL | Status: DC | PRN
  Administered 2015-02-26: 80 mg via ORAL
  Filled 2015-02-25: qty 1

## 2015-02-25 MED ORDER — PROPOFOL 10 MG/ML IV BOLUS
INTRAVENOUS | Status: AC
  Filled 2015-02-25: qty 20

## 2015-02-25 MED ORDER — MORPHINE SULFATE 0.5 MG/ML IJ SOLN
INTRAMUSCULAR | Status: DC | PRN
  Administered 2015-02-25: 4.8 mg via INTRAVENOUS

## 2015-02-25 MED ORDER — NALBUPHINE HCL 10 MG/ML IJ SOLN: 5.0000 mg | INTRAMUSCULAR | Status: DC | PRN

## 2015-02-25 MED ORDER — HYDROMORPHONE HCL 1 MG/ML IJ SOLN
1.0000 mg | INTRAMUSCULAR | Status: DC | PRN
  Administered 2015-02-25: 1 mg via INTRAVENOUS

## 2015-02-25 MED ORDER — ZOLPIDEM TARTRATE 5 MG PO TABS
5.0000 mg | ORAL_TABLET | Freq: Every evening | ORAL | Status: DC | PRN
Start: 2015-02-25 — End: 2015-02-28

## 2015-02-25 MED ORDER — LACTATED RINGERS IV SOLN
INTRAVENOUS | Status: DC
  Administered 2015-02-25: 11:00:00 via INTRAVENOUS

## 2015-02-25 MED ORDER — OXYTOCIN 40 UNITS IN LACTATED RINGERS INFUSION - SIMPLE MED: INTRAVENOUS | Status: DC | PRN

## 2015-02-25 MED ORDER — HYDROMORPHONE HCL 1 MG/ML IJ SOLN
1.0000 mg | Freq: Once | INTRAMUSCULAR | Status: AC
  Administered 2015-02-25: 1 mg via INTRAVENOUS
  Filled 2015-02-25: qty 1

## 2015-02-25 MED ORDER — GLYCOPYRROLATE 0.2 MG/ML IJ SOLN
INTRAMUSCULAR | Status: AC
  Filled 2015-02-25: qty 2

## 2015-02-25 MED ORDER — WITCH HAZEL-GLYCERIN EX PADS: 1.0000 "application " | MEDICATED_PAD | CUTANEOUS | Status: DC | PRN

## 2015-02-25 MED ORDER — OXYCODONE-ACETAMINOPHEN 5-325 MG PO TABS
2.0000 | ORAL_TABLET | ORAL | Status: DC | PRN
  Administered 2015-02-25 – 2015-02-27 (×8): 2 via ORAL
  Administered 2015-02-27: 1 via ORAL
  Administered 2015-02-27 – 2015-02-28 (×2): 2 via ORAL
  Filled 2015-02-25 (×11): qty 2

## 2015-02-25 MED ORDER — DIPHENHYDRAMINE HCL 25 MG PO CAPS: 25.0000 mg | ORAL_CAPSULE | Freq: Four times a day (QID) | ORAL | Status: DC | PRN

## 2015-02-25 MED ORDER — ONDANSETRON HCL 4 MG/2ML IJ SOLN: 4.0000 mg | Freq: Three times a day (TID) | INTRAMUSCULAR | Status: DC | PRN

## 2015-02-25 MED ORDER — SODIUM CHLORIDE 0.9 % IV SOLN
10.0000 mL/h | Freq: Once | INTRAVENOUS | Status: AC
Start: 2015-02-25 — End: 2015-02-25
  Administered 2015-02-25: 03:00:00 via INTRAVENOUS

## 2015-02-25 MED ORDER — NALOXONE HCL 1 MG/ML IJ SOLN
1.0000 ug/kg/h | INTRAMUSCULAR | Status: DC | PRN
  Filled 2015-02-25: qty 2

## 2015-02-25 MED ORDER — DIPHENHYDRAMINE HCL 50 MG/ML IJ SOLN: 12.5000 mg | INTRAMUSCULAR | Status: DC | PRN

## 2015-02-25 MED ORDER — OXYCODONE-ACETAMINOPHEN 5-325 MG PO TABS
1.0000 | ORAL_TABLET | ORAL | Status: DC | PRN
  Administered 2015-02-25 – 2015-02-28 (×3): 1 via ORAL
  Filled 2015-02-25 (×3): qty 1

## 2015-02-25 MED ORDER — ONDANSETRON HCL 4 MG/2ML IJ SOLN
INTRAMUSCULAR | Status: DC | PRN
  Administered 2015-02-25: 4 mg via INTRAVENOUS

## 2015-02-25 MED ORDER — NALOXONE HCL 0.4 MG/ML IJ SOLN: 0.4000 mg | INTRAMUSCULAR | Status: DC | PRN

## 2015-02-25 MED ORDER — ACETAMINOPHEN 500 MG PO TABS
1000.0000 mg | ORAL_TABLET | Freq: Four times a day (QID) | ORAL | Status: AC
  Administered 2015-02-25: 1000 mg via ORAL
  Filled 2015-02-25 (×3): qty 2

## 2015-02-25 MED ORDER — MEPERIDINE HCL 25 MG/ML IJ SOLN: 6.2500 mg | INTRAMUSCULAR | Status: DC | PRN

## 2015-02-25 MED ORDER — PHENYLEPHRINE 8 MG IN D5W 100 ML (0.08MG/ML) PREMIX OPTIME
INJECTION | INTRAVENOUS | Status: DC | PRN
  Administered 2015-02-25: 60 ug/min via INTRAVENOUS

## 2015-02-25 MED ORDER — BUPRENORPHINE HCL 8 MG SL SUBL
14.0000 mg | SUBLINGUAL_TABLET | Freq: Every day | SUBLINGUAL | Status: DC
  Administered 2015-02-26 – 2015-02-28 (×3): 14 mg via SUBLINGUAL
  Filled 2015-02-25 (×3): qty 3

## 2015-02-25 MED ORDER — SIMETHICONE 80 MG PO CHEW
80.0000 mg | CHEWABLE_TABLET | Freq: Three times a day (TID) | ORAL | Status: DC
  Administered 2015-02-25 – 2015-02-28 (×11): 80 mg via ORAL
  Filled 2015-02-25 (×11): qty 1

## 2015-02-25 MED ORDER — MORPHINE SULFATE (PF) 0.5 MG/ML IJ SOLN
INTRAMUSCULAR | Status: DC | PRN
  Administered 2015-02-25: 200 ug via INTRATHECAL

## 2015-02-25 MED ORDER — ONDANSETRON HCL 4 MG/2ML IJ SOLN
INTRAMUSCULAR | Status: AC
  Filled 2015-02-25: qty 2

## 2015-02-25 MED ORDER — LIDOCAINE HCL (CARDIAC) 20 MG/ML IV SOLN
INTRAVENOUS | Status: AC
  Filled 2015-02-25: qty 5

## 2015-02-25 MED ORDER — OXYTOCIN 40 UNITS IN LACTATED RINGERS INFUSION - SIMPLE MED: 62.5000 mL/h | INTRAVENOUS | Status: AC

## 2015-02-25 MED ORDER — SENNOSIDES-DOCUSATE SODIUM 8.6-50 MG PO TABS
2.0000 | ORAL_TABLET | ORAL | Status: DC
  Administered 2015-02-26 – 2015-02-27 (×3): 2 via ORAL
  Filled 2015-02-25 (×3): qty 2

## 2015-02-25 MED ORDER — KETOROLAC TROMETHAMINE 30 MG/ML IJ SOLN
INTRAMUSCULAR | Status: AC
  Filled 2015-02-25: qty 1

## 2015-02-25 MED ORDER — CLINDAMYCIN PHOSPHATE 900 MG/50ML IV SOLN
900.0000 mg | Freq: Once | INTRAVENOUS | Status: AC
  Administered 2015-02-25: 900 mg via INTRAVENOUS
  Filled 2015-02-25: qty 50

## 2015-02-25 MED ORDER — DEXAMETHASONE SODIUM PHOSPHATE 4 MG/ML IJ SOLN
INTRAMUSCULAR | Status: AC
  Filled 2015-02-25: qty 1

## 2015-02-25 MED ORDER — PRENATAL MULTIVITAMIN CH
1.0000 | ORAL_TABLET | Freq: Every day | ORAL | Status: DC
  Administered 2015-02-25 – 2015-02-28 (×4): 1 via ORAL
  Filled 2015-02-25 (×4): qty 1

## 2015-02-25 MED ORDER — KETOROLAC TROMETHAMINE 30 MG/ML IJ SOLN
30.0000 mg | Freq: Four times a day (QID) | INTRAMUSCULAR | Status: DC | PRN
  Administered 2015-02-25: 30 mg via INTRAMUSCULAR

## 2015-02-25 MED ORDER — SCOPOLAMINE 1 MG/3DAYS TD PT72: 1.0000 | MEDICATED_PATCH | Freq: Once | TRANSDERMAL | Status: DC

## 2015-02-25 MED ORDER — GENTAMICIN SULFATE 40 MG/ML IJ SOLN
5.0000 mg/kg | Freq: Once | INTRAVENOUS | Status: AC
  Administered 2015-02-25: 109.25 mg via INTRAVENOUS
  Filled 2015-02-25: qty 9.25

## 2015-02-25 MED ORDER — OXYTOCIN 40 UNITS IN LACTATED RINGERS INFUSION - SIMPLE MED
INTRAVENOUS | Status: DC | PRN
  Administered 2015-02-25: 500 mL via INTRAVENOUS

## 2015-02-25 MED ORDER — NEOSTIGMINE METHYLSULFATE 10 MG/10ML IV SOLN
INTRAVENOUS | Status: AC
  Filled 2015-02-25: qty 1

## 2015-02-25 MED ORDER — LANOLIN HYDROUS EX OINT: 1.0000 "application " | TOPICAL_OINTMENT | CUTANEOUS | Status: DC | PRN

## 2015-02-25 MED ORDER — FENTANYL CITRATE (PF) 100 MCG/2ML IJ SOLN
INTRAMUSCULAR | Status: DC | PRN
  Administered 2015-02-25: 10 ug via INTRATHECAL
  Administered 2015-02-25: 50 ug via INTRAVENOUS
  Administered 2015-02-25: 40 ug via INTRAVENOUS
  Administered 2015-02-25 (×3): 50 ug via INTRAVENOUS

## 2015-02-25 MED ORDER — CITRIC ACID-SODIUM CITRATE 334-500 MG/5ML PO SOLN
ORAL | Status: AC
  Administered 2015-02-25: 30 mL
  Filled 2015-02-25: qty 15

## 2015-02-25 MED ORDER — ACETAMINOPHEN 325 MG PO TABS: 650.0000 mg | ORAL_TABLET | ORAL | Status: DC | PRN

## 2015-02-25 MED ORDER — DIBUCAINE 1 % RE OINT: 1.0000 "application " | TOPICAL_OINTMENT | RECTAL | Status: DC | PRN

## 2015-02-25 MED ORDER — BUPIVACAINE IN DEXTROSE 0.75-8.25 % IT SOLN
INTRATHECAL | Status: DC | PRN
  Administered 2015-02-25: 1.5 mL via INTRATHECAL

## 2015-02-25 MED ORDER — FENTANYL CITRATE (PF) 250 MCG/5ML IJ SOLN
INTRAMUSCULAR | Status: AC
  Filled 2015-02-25: qty 25

## 2015-02-25 MED ORDER — DEXAMETHASONE SODIUM PHOSPHATE 4 MG/ML IJ SOLN
INTRAMUSCULAR | Status: DC | PRN
  Administered 2015-02-25: 4 mg via INTRAVENOUS

## 2015-02-25 MED ORDER — MIDAZOLAM HCL 2 MG/2ML IJ SOLN
INTRAMUSCULAR | Status: DC | PRN
  Administered 2015-02-25 (×2): 1 mg via INTRAVENOUS

## 2015-02-25 MED ORDER — SCOPOLAMINE 1 MG/3DAYS TD PT72
MEDICATED_PATCH | TRANSDERMAL | Status: DC | PRN
  Administered 2015-02-25: 1 via TRANSDERMAL

## 2015-02-25 MED ORDER — PROMETHAZINE HCL 25 MG/ML IJ SOLN: 6.2500 mg | INTRAMUSCULAR | Status: DC | PRN

## 2015-02-25 MED ORDER — IBUPROFEN 600 MG PO TABS
600.0000 mg | ORAL_TABLET | Freq: Four times a day (QID) | ORAL | Status: DC
  Administered 2015-02-25 – 2015-02-28 (×13): 600 mg via ORAL
  Filled 2015-02-25 (×13): qty 1

## 2015-02-25 MED ORDER — KETOROLAC TROMETHAMINE 30 MG/ML IJ SOLN: 30.0000 mg | Freq: Four times a day (QID) | INTRAMUSCULAR | Status: DC | PRN

## 2015-02-25 MED ORDER — BUPRENORPHINE HCL 8 MG SL SUBL
10.0000 mg | SUBLINGUAL_TABLET | Freq: Once | SUBLINGUAL | Status: AC
  Administered 2015-02-25: 10 mg via SUBLINGUAL

## 2015-02-25 MED ORDER — ACETAMINOPHEN 10 MG/ML IV SOLN
1000.0000 mg | Freq: Once | INTRAVENOUS | Status: AC
  Administered 2015-02-25: 1000 mg via INTRAVENOUS
  Filled 2015-02-25: qty 100

## 2015-02-25 SURGICAL SUPPLY — 31 items
CLAMP CORD UMBIL (MISCELLANEOUS) IMPLANT
CLIP FILSHIE TUBAL LIGA STRL (Clip) ×2 IMPLANT
CLOTH BEACON ORANGE TIMEOUT ST (SAFETY) ×3 IMPLANT
DRAPE SHEET LG 3/4 BI-LAMINATE (DRAPES) IMPLANT
DRSG OPSITE POSTOP 4X10 (GAUZE/BANDAGES/DRESSINGS) ×3 IMPLANT
DURAPREP 26ML APPLICATOR (WOUND CARE) ×3 IMPLANT
ELECT REM PT RETURN 9FT ADLT (ELECTROSURGICAL) ×3
ELECTRODE REM PT RTRN 9FT ADLT (ELECTROSURGICAL) ×1 IMPLANT
EXTRACTOR VACUUM M CUP 4 TUBE (SUCTIONS) IMPLANT
EXTRACTOR VACUUM M CUP 4' TUBE (SUCTIONS)
GLOVE BIOGEL PI IND STRL 6.5 (GLOVE) ×1 IMPLANT
GLOVE BIOGEL PI INDICATOR 6.5 (GLOVE) ×2
GLOVE ECLIPSE 6.5 STRL STRAW (GLOVE) ×3 IMPLANT
GOWN STRL REUS W/TWL LRG LVL3 (GOWN DISPOSABLE) ×6 IMPLANT
KIT ABG SYR 3ML LUER SLIP (SYRINGE) IMPLANT
NDL HYPO 25X5/8 SAFETYGLIDE (NEEDLE) IMPLANT
NEEDLE HYPO 25X5/8 SAFETYGLIDE (NEEDLE) IMPLANT
NS IRRIG 1000ML POUR BTL (IV SOLUTION) ×3 IMPLANT
PACK C SECTION WH (CUSTOM PROCEDURE TRAY) ×3 IMPLANT
PAD ABD 7.5X8 STRL (GAUZE/BANDAGES/DRESSINGS) IMPLANT
PAD OB MATERNITY 4.3X12.25 (PERSONAL CARE ITEMS) ×3 IMPLANT
RTRCTR C-SECT PINK 25CM LRG (MISCELLANEOUS) ×3 IMPLANT
SUT MON AB 2-0 CT1 27 (SUTURE) ×3 IMPLANT
SUT MON AB 4-0 PS1 27 (SUTURE) IMPLANT
SUT PDS AB 0 CTX 60 (SUTURE) ×2 IMPLANT
SUT PLAIN 2 0 XLH (SUTURE) IMPLANT
SUT VIC AB 0 CTX 36 (SUTURE) ×12
SUT VIC AB 0 CTX36XBRD ANBCTRL (SUTURE) ×4 IMPLANT
SUT VIC AB 4-0 KS 27 (SUTURE) IMPLANT
TOWEL OR 17X24 6PK STRL BLUE (TOWEL DISPOSABLE) ×3 IMPLANT
TRAY FOLEY CATH SILVER 14FR (SET/KITS/TRAYS/PACK) ×3 IMPLANT

## 2015-02-25 NOTE — Discharge Instructions (Signed)
Iron-Rich Diet An iron-rich diet contains foods that are good sources of iron. Iron is an important mineral that helps your body produce hemoglobin. Hemoglobin is a protein in red blood cells that carries oxygen to the body's tissues. Sometimes, the iron level in your blood can be low. This may be caused by:  A lack of iron in your diet.  Blood loss.  Times of growth, such as during pregnancy or during a child's growth and development. Low levels of iron can cause a decrease in the number of red blood cells. This can result in iron deficiency anemia. Iron deficiency anemia symptoms include:  Tiredness.  Weakness.  Irritability.  Increased chance of infection. Here are some recommendations for daily iron intake:  Males older than 36 years of age need 8 mg of iron per day.  Women ages 19 to 50 need 18 mg of iron per day.  Pregnant women need 27 mg of iron per day, and women who are over 19 years of age and breastfeeding need 9 mg of iron per day.  Women over the age of 50 need 8 mg of iron per day. SOURCES OF IRON There are 2 types of iron that are found in food: heme iron and nonheme iron. Heme iron is absorbed by the body better than nonheme iron. Heme iron is found in meat, poultry, and fish. Nonheme iron is found in grains, beans, and vegetables. Heme Iron Sources Food / Iron (mg)  Chicken liver, 3 oz (85 g)/ 10 mg  Beef liver, 3 oz (85 g)/ 5.5 mg  Oysters, 3 oz (85 g)/ 8 mg  Beef, 3 oz (85 g)/ 2 to 3 mg  Shrimp, 3 oz (85 g)/ 2.8 mg  Turkey, 3 oz (85 g)/ 2 mg  Chicken, 3 oz (85 g) / 1 mg  Fish (tuna, halibut), 3 oz (85 g)/ 1 mg  Pork, 3 oz (85 g)/ 0.9 mg Nonheme Iron Sources Food / Iron (mg)  Ready-to-eat breakfast cereal, iron-fortified / 3.9 to 7 mg  Tofu,  cup / 3.4 mg  Kidney beans,  cup / 2.6 mg  Baked potato with skin / 2.7 mg  Asparagus,  cup / 2.2 mg  Avocado / 2 mg  Dried peaches,  cup / 1.6 mg  Raisins,  cup / 1.5 mg  Soy milk, 1 cup  / 1.5 mg  Whole-wheat bread, 1 slice / 1.2 mg  Spinach, 1 cup / 0.8 mg  Broccoli,  cup / 0.6 mg IRON ABSORPTION Certain foods can decrease the body's absorption of iron. Try to avoid these foods and beverages while eating meals with iron-containing foods:  Coffee.  Tea.  Fiber.  Soy. Foods containing vitamin C can help increase the amount of iron your body absorbs from iron sources, especially from nonheme sources. Eat foods with vitamin C along with iron-containing foods to increase your iron absorption. Foods that are high in vitamin C include many fruits and vegetables. Some good sources are:  Fresh orange juice.  Oranges.  Strawberries.  Mangoes.  Grapefruit.  Red bell peppers.  Green bell peppers.  Broccoli.  Potatoes with skin.  Tomato juice. Document Released: 02/15/2005 Document Revised: 09/26/2011 Document Reviewed: 12/23/2010 ExitCare Patient Information 2015 [employer]. This information is not intended to replace advice given to you by your health care provider. Make sure you discuss any questions you have with your health care provider.  

## 2015-02-25 NOTE — Transfer of Care (Signed)
Immediate Anesthesia Transfer of Care Note  Patient: Rebecca Meyers  Procedure(s) Performed: Procedure(s): CESAREAN SECTION WITH BILATERAL TUBAL LIGATION (N/A)  Patient Location: PACU  Anesthesia Type:Spinal  Level of Consciousness: awake, alert  and oriented  Airway & Oxygen Therapy: Patient Spontanous Breathing  Post-op Assessment: Report given to RN and Post -op Vital signs reviewed and stable  Post vital signs: Reviewed and stable  Last Vitals:  Filed Vitals:   02/25/15 0206  BP: 147/68  Pulse: 85  Temp:   Resp: 22    Complications: No apparent anesthesia complications

## 2015-02-25 NOTE — Op Note (Signed)
Rebecca Meyers, Rebecca Meyers              ACCOUNT NO.:  [id number]  MEDICAL RECORD NO.:  [id number]  LOCATION:  9319                          FACILITY:  [facility]  PHYSICIAN:  Philip Aspen, DO    DATE OF BIRTH:  1980-11-22  DATE OF PROCEDURE:  02/25/2015 DATE OF DISCHARGE:                              OPERATIVE REPORT   PREOPERATIVE DIAGNOSIS:  A 35 week 5 day intrauterine pregnancy with 7 cm placental abruption and fetal heart rate deceleration.  POSTOPERATIVE DIAGNOSIS:  A 35 week 5 day intrauterine pregnancy with 7 cm placental abruption and fetal heart rate deceleration.  PROCEDURE:  Low-transverse cesarean section with bilateral tubal ligation.  SURGEON:  Philip Aspen, DO.  IV FLUIDS:  3100 mL.  URINE OUTPUT:  200 mL.  ESTIMATED BLOOD LOSS:  1000 mL.  SPECIMEN:  Specimens were sent to Pathology.  FINDINGS:  Female infant in cephalic presentation with Apgars of 8 and 8, with weight pending.  Moderate blood clot adherent to placenta. Adhesions of bowel to left anterior uterine serosa.  DESCRIPTION OF PROCEDURE:  I was called by Nursing and alerted that the patient reported abdomen becoming very firm.  At the same time, a fetal deceleration to the 90s was noted.  Otherwise, baseline had been ranging from approximately 110-130 with good variability and acceleration. Given a known placental abruption and fetal deceleration, a decision was made to proceed with cesarean section in the merging fashion.  The patient was consented and brought to the operating room.  Desire for tubal ligation was reaffirmed.  A Pfannenstiel skin incision was made with the scalpel and carried down to the underlying fascia with Bovie cautery.  The fascia was incised in the midline and extended laterally with Mayo scissors.  Of note, planes were obliterated due to prior C- section scar tissue.  The fascia was grasped with the superior aspect with Kocher clamps, and rectus muscles were dissected off  bluntly and sharply.  The same was done at the inferior aspect of the fascial incision.  The rectus muscles were separated with hemostats, and the peritoneum was identified and entered bluntly.  The peritoneal incision was extended by manual lateral traction.  Next, Alexis self retractor was placed, and a bladder flap was created by entering the vesicouterine peritoneum with Metzenbaum scissors and extending it laterally and then developing the bladder flap visually.  A low transverse cesarean incision was made, and the amniotic sac entered bluntly.  The infant's head was located, elevated, and delivered through the hysterotomy followed by the remainder of the infant's body.  The cord was clamped and cut, and the infant was bulb suctioned and handed off to awaiting neonatology.  Cord blood was collected, and placenta was already partially delivered from abruption, and moderate amount of clot was noted.  Some bloody fluid was noted at the time of the hysterotomy. Placenta was removed.  Uterus was cleared of all clot and debris.  The uterine incision was closed with 2-0 Vicryl in a running fashion; and additional bleeding was noted on the right side.  So, a second layer was placed, and excellent hemostasis was noted.  The second layer was a vertical imbrication.  The uterine incision  was re-examined after Alexis self retractor was removed and found to be hemostatic.  Fascial and rectus muscle layers were examined and found to be hemostatic.  The peritoneum was obliterated and could not be reapproximated and closed. Both ovaries were examined and found to be normal.  The right fallopian tube was elevated with a Babcock, followed to its fimbriated end, and a Filshie clip was placed, same was done on the left side.  This was performed after a second verification of consent for bilateral tubal ligation was verbally obtained from the patient.  The fascia was then reapproximated and closed with  Vicryl in a running fashion. Subcutaneous tissue was irrigated, dried, and minimal use of Bovie cautery was needed for hemostasis.  Skin was then reapproximated and closed with staples.  The patient tolerated the procedure well.  Sponge, lap, and needle counts were correct x2.  The patient was taken to Recovery in stable condition.          ______________________________ Philip Aspen, DO     [place]/MEDQ  D:  02/25/2015  T:  02/25/2015  Job:  [id number]

## 2015-02-25 NOTE — Lactation Note (Signed)
This note was copied from the chart of Boy Sherylann Vangorden. Lactation Consultation Note  Patient Name: Boy Shigeko Manard [id number] Date: 02/25/2015 Reason for consult: Initial assessment;NICU baby  NICU baby 7 hours old. Mom reports that her milk came in with first baby, 36-year-old, but she did not nurse. Mom is on Subutex, so discussed benefits of providing EBM to baby. Set mom up with DEBP and began pumping for 15 minutes. Mom accidentally spilled some colostrum, but we were able to collect several mls and this LC took to NICU. FOB states that he does not want to go to NICU until MOB can go. Mom sleepy and in a lot of pain, and her food has just arrived, so teaching will need to be done later today. Discussed with mom that she needs to pump every 3 hours for 15 minutes. Discussed assessment and interventions with patient's RN, Kathie Rhodes.  Maternal Data Does the patient have breastfeeding experience prior to this delivery?: Yes  Feeding    LATCH Score/Interventions                      Lactation Tools Discussed/Used Pump Review: Setup, frequency, and cleaning;Milk Storage Initiated by:: JW Date initiated:: 02/25/15   Consult Status Consult Status: Follow-up Date: 02/26/15 Follow-up type: In-patient    Geralynn Ochs 02/25/2015, 10:46 AM

## 2015-02-25 NOTE — Brief Op Note (Signed)
02/24/2015 - 02/25/2015  3:36 AM  PATIENT:  Rebecca Meyers  34 y.o. female  PRE-OPERATIVE DIAGNOSIS:  Placental Abruption with Fetal heart rate Deceleration  POST-OPERATIVE DIAGNOSIS:  Placental Abruption with Fetal heart rate Deceleration  PROCEDURE:  Procedure(s): CESAREAN SECTION WITH BILATERAL TUBAL LIGATION (N/A)  SURGEON:  Surgeon(s) and Role:    Philip Aspen, DO - Primary  ANESTHESIA:   spinal  EBL:  Total I/O In: 3100 [I.V.:3100] Out: 1200 [Urine:200; Blood:1000]   SPECIMEN:  Source of Specimen:  placenta to pathology  DISPOSITION OF SPECIMEN:  PATHOLOGY  COUNTS:  YES  PLAN OF CARE: Admit to inpatient   PATIENT DISPOSITION:  PACU - hemodynamically stable.

## 2015-02-25 NOTE — Addendum Note (Signed)
Addendum  created 02/25/15 1027 by Shanon Payor, CRNA   Modules edited: Notes Section   Notes Section:  File: [id number]

## 2015-02-25 NOTE — Anesthesia Procedure Notes (Signed)
Spinal Patient location during procedure: OR Start time: 02/25/2015 2:39 AM End time: 02/25/2015 2:53 AM Staffing Anesthesiologist: Sebastian Ache Preanesthetic Checklist Completed: patient identified, site marked, surgical consent, pre-op evaluation, timeout performed, IV checked, risks and benefits discussed and monitors and equipment checked Spinal Block Patient position: sitting Prep: site prepped and draped and DuraPrep Patient monitoring: heart rate, continuous pulse ox and blood pressure Location: L3-4 Injection technique: single-shot Needle Needle gauge: 24 G Needle length: 9 cm Needle insertion depth: 6 cm Assessment Sensory level: T4 Additional Notes No complications

## 2015-02-25 NOTE — Progress Notes (Signed)
Received a call from [place], [place] that Dr. Claiborne Billings wants patient to be prepped for OR for C/S. Acknowledged.

## 2015-02-25 NOTE — Anesthesia Postprocedure Evaluation (Signed)
  Anesthesia Post-op Note  Patient: Rebecca Meyers  Procedure(s) Performed: Procedure(s): CESAREAN SECTION WITH BILATERAL TUBAL LIGATION (N/A)  Patient Location: PACU  Anesthesia Type:Spinal  Level of Consciousness: awake, alert  and oriented  Airway and Oxygen Therapy: Patient Spontanous Breathing  Post-op Pain: mild  Post-op Assessment: Post-op Vital signs reviewed, Patient's Cardiovascular Status Stable, Respiratory Function Stable, Patent Airway and No signs of Nausea or vomiting              Post-op Vital Signs: Reviewed and stable  Last Vitals:  Filed Vitals:   02/25/15 0206  BP: 147/68  Pulse: 85  Temp:   Resp: 22    Complications: No apparent anesthesia complications

## 2015-02-25 NOTE — Anesthesia Preprocedure Evaluation (Deleted)
Anesthesia Evaluation  Patient identified by MRN, date of birth, ID band Patient awake and Patient confused    Reviewed: Allergy & Precautions, H&P , NPO status , Patient's Chart, lab work & pertinent test results  Airway Mallampati: II       Dental   Pulmonary Current Smoker,  breath sounds clear to auscultation  Pulmonary exam normal       Cardiovascular Exercise Tolerance: Good Normal cardiovascular examRhythm:regular Rate:Normal     Neuro/Psych    GI/Hepatic   Endo/Other    Renal/GU      Musculoskeletal   Abdominal   Peds  Hematology 11/33  plts 333   Anesthesia Other Findings   Reproductive/Obstetrics (+) Pregnancy hweavy bleeding 35 weeks                             Anesthesia Physical Anesthesia Plan  ASA: II and emergent  Anesthesia Plan: Spinal and General   Post-op Pain Management:    Induction:   Airway Management Planned:   Additional Equipment:   Intra-op Plan:   Post-operative Plan:   Informed Consent: I have reviewed the patients History and Physical, chart, labs and discussed the procedure including the risks, benefits and alternatives for the proposed anesthesia with the patient or authorized representative who has indicated his/her understanding and acceptance.     Plan Discussed with:   Anesthesia Plan Comments:         Anesthesia Quick Evaluation

## 2015-02-25 NOTE — Progress Notes (Signed)
Called out for [employer], RN to phone Dr. Claiborne Billings due to patient having decel into 90's and uterus is firm with no relaxation. Acknowledged. Call placed per Stoney Bang, RN.

## 2015-02-25 NOTE — Lactation Note (Signed)
This note was copied from the chart of Rebecca Deanza Upperman. Lactation Consultation Note  Patient Name: Rebecca Meyers [id number] Date: 02/25/2015 Reason for consult: Follow-up assessment;NICU baby  NICU baby 5 hours old. Assisted mom to start pumping again. Mom still  sleepy and has a room full of visitors. Recorded current pumping time, and next pumping time in mom's NICU booklet. Enc mom to keep pumping every 3 hours for 15 minutes. Mom able to repeat pumping instructions verbally. Enc mom to call for assistance as needed.  Maternal Data Does the patient have breastfeeding experience prior to this delivery?: Yes  Feeding    LATCH Score/Interventions                      Lactation Tools Discussed/Used Pump Review: Setup, frequency, and cleaning;Milk Storage Initiated by:: JW Date initiated:: 02/25/15   Consult Status Consult Status: Follow-up Date: 02/26/15 Follow-up type: In-patient    Geralynn Ochs 02/25/2015, 2:32 PM

## 2015-02-25 NOTE — Anesthesia Postprocedure Evaluation (Signed)
  Anesthesia Post-op Note  Patient: Rebecca Meyers  Procedure(s) Performed: Procedure(s): CESAREAN SECTION WITH BILATERAL TUBAL LIGATION (N/A)  Patient Location: Mother/Baby  Anesthesia Type:Spinal  Level of Consciousness: awake, alert  and oriented  Airway and Oxygen Therapy: Patient Spontanous Breathing  Post-op Pain: none  Post-op Assessment: Post-op Vital signs reviewed, Patient's Cardiovascular Status Stable, Respiratory Function Stable, Patent Airway, No signs of Nausea or vomiting, Adequate PO intake, Pain level controlled, No headache, No backache and Patient able to bend at knees  Post-op Vital Signs: Reviewed and stable  Last Vitals:  Filed Vitals:   02/25/15 0830  BP: 150/87  Pulse: 60  Temp: 36.6 C  Resp: 18    Complications: No apparent anesthesia complications

## 2015-02-25 NOTE — Anesthesia Preprocedure Evaluation (Signed)
Anesthesia Evaluation  Patient identified by MRN, date of birth, ID band Patient awake and Patient confused    Reviewed: Allergy & Precautions, H&P , NPO status , Patient's Chart, lab work & pertinent test results  Airway Mallampati: II       Dental   Pulmonary Current Smoker,  breath sounds clear to auscultation  Pulmonary exam normal       Cardiovascular Exercise Tolerance: Good Normal cardiovascular examRhythm:regular Rate:Normal     Neuro/Psych    GI/Hepatic   Endo/Other    Renal/GU      Musculoskeletal   Abdominal   Peds  Hematology   Anesthesia Other Findings   Reproductive/Obstetrics (+) Pregnancy heavy bleeding                             Anesthesia Physical Anesthesia Plan  ASA: II and emergent  Anesthesia Plan: Spinal   Post-op Pain Management:    Induction:   Airway Management Planned:   Additional Equipment:   Intra-op Plan:   Post-operative Plan:   Informed Consent: I have reviewed the patients History and Physical, chart, labs and discussed the procedure including the risks, benefits and alternatives for the proposed anesthesia with the patient or authorized representative who has indicated his/her understanding and acceptance.     Plan Discussed with:   Anesthesia Plan Comments:         Anesthesia Quick Evaluation

## 2015-02-26 ENCOUNTER — Encounter (HOSPITAL_COMMUNITY): Admission: RE | Payer: Self-pay | Source: Ambulatory Visit

## 2015-02-26 ENCOUNTER — Encounter (HOSPITAL_COMMUNITY): Payer: Self-pay | Admitting: Obstetrics and Gynecology

## 2015-02-26 ENCOUNTER — Inpatient Hospital Stay (HOSPITAL_COMMUNITY)
Admission: RE | Admit: 2015-02-26 | Payer: Medicaid Other | Source: Ambulatory Visit | Admitting: Obstetrics & Gynecology

## 2015-02-26 LAB — RPR: RPR: NONREACTIVE

## 2015-02-26 SURGERY — Surgical Case
Anesthesia: Regional

## 2015-02-26 NOTE — Progress Notes (Signed)
CLINICAL SOCIAL WORK MATERNAL/CHILD NOTE  Patient Details  Name: Boy Kately Graffam MRN: [id number] Date of Birth: 02/25/2017  Date:  02/26/2015  Clinical Social Worker Initiating Note:  Derk Doubek E. Brigitte Pulse, [place] Date/ Time Initiated:  02/26/15/1600     Child's Name:  Rebecca Eaton   Legal Guardian:   (Parents: Everett Graff and Carma Lair)   Need for Interpreter:  None   Date of Referral:        Reason for Referral:   (No referral-NICU admission)   Referral Source:      Address:   ([address], [place], [place] [address])  Phone number:  [phone]   Household Members:  Minor Children, Significant Other (Parents have one other child: Memphis, age 9)   Natural Supports (not living in the home):  Extended Family, Immediate Family (MOB reports having a huge support network)   Chiropodist:     Employment:     Type of Work:  (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 Tobie Lords at [facility] in [place], [place].)   Risk Factors/Current Problems:  None   Cognitive State:  Alert , Insightful , Goal Oriented , Linear Thinking    Mood/Affect:  Interested , Calm , Comfortable , Relaxed , Happy    CSW Assessment: CSW met with MOB in her third floor room/319 to introduce myself, offer support, and complete assessment due to NICU admission at 36 weeks.  MOB presented as pleasant and welcoming of CSW's visit.  She reports that she is "doing great" and pleased with how baby is doing.  She states she has a great support system.  She and FOB have one other child, Memphis, age 38 and live together in [place].  She  reports both her and FOB's families are involved and supportive.  MOB reports that she just had her baby shower on Saturday and that they have all necessary supplies for baby at home.  She states her parents live close to the hospital in [place] and can stay with them if she chooses.  She states no issues with transportation.   CSW inquired about MOB's prescribed Subutex.  She states she had a dental procedure and [place] causing her to be on narcotics for a few weeks in the first trimester of pregnancy.  She reports she was approximately [redacted] weeks pregnant when she started taking the prescribed Vicodin, but states she was unaware of the pregnancy at that time.  Once discovering she was pregnant, she states it was advised that she start an opiate replacement therapy instead of stopping the medication completely during pregnancy.  She began taking Subutex at this time and states she goes to [facility] one time per week to get the medication.  She was adamant that this was not what she wanted to do, but states she felt she didn't have a choice.  She reports feeling guilty now that baby may suffer due to this.  MOB wonders if baby would have been fine if she had just stopped the medication.  CSW explained that baby is here now and that stopping a narcotic  during pregnancy can lead to pregnancy loss.  CSW encouraged her to move forward from this point rather than wondering what could have been.  CSW explained important non-pharmaceutical interventions that she and her family can provide to baby.  However, CSW also discussed the importance of taking care of herself and how it is impossible for her to be with baby 24 hours a day while he is in the hospital.  CSW helped MOB think about a routine and way to balance having a baby in the hospital and responsibilities, including another child, at home.  CSW explained hospital drug screen policy and MOB states no concerns.  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  CSW cautioned MOB on having expectations about discharge.  CSW encouraged her to enjoy bonding with baby regardless of his setting.  CSW also discussed the possibility of an extended hospitalization if baby withdrawals from the Subutex.  MOB replied, "everyone keeps saying that, but I think he's going to be fine.  I have faith."  CSW encouraged hope and faith, and to keep an open mind about the possibility of needed pharmaceutical intervention.  CSW encouraged breast pumping and explained that baby will benefit from getting some of her Subutex medication through the breast milk.  She states she is trying to pump because she has been told it will be good for baby, but seemed somewhat frustrated by it.  She states she does not think she will be able to pump when she goes back to work in 4-6 weeks.  CSW explained that it will be important to talk with the neonatologists or her pediatrician (depending on whether baby is home or in the hospital) when she decides to stop pumping so that baby can be weaned from her breast milk.  She stated understanding.   MOB reports no emotional concerns at this time and no hx of PPD.  PPD signs and symptoms discussed.  MOB informed of ongoing support services offered by NICU CSW.  CSW provided contact information and encouraged MOB to call any time.  MOB seemed appreciative of the visit.  CSW Plan/Description:  Patient/Family Education , Psychosocial Support and Ongoing Assessment of Needs    Kalman Shan 02/26/2015, 4:57 PM

## 2015-02-26 NOTE — Lactation Note (Signed)
This note was copied from the chart of Rebecca Meyers. Lactation Consultation Note  Follow up visit made.  Mom is pumping every 3 hours and obtaining 5-10 mls of colostrum.  She states I don't really like to do it but I will.  Praised for her efforts.  Mom will call [facility] to inquire about pump.  Instructed on using standard pump setting.  Encouraged to call with questions prn.  Patient Name: Rebecca Meyers [id number] Date: 02/26/2015     Maternal Data    Feeding Feeding Type: Formula Nipple Type: Slow - flow Length of feed: 60 min  LATCH Score/Interventions                      Lactation Tools Discussed/Used     Consult Status      Huston Foley 02/26/2015, 11:53 AM

## 2015-02-27 MED ORDER — OXYCODONE HCL 10 MG PO TABS: 10.0000 mg | ORAL_TABLET | ORAL | Status: DC | PRN

## 2015-02-27 NOTE — Lactation Note (Signed)
This note was copied from the chart of Rebecca Meyers. Lactation Consultation Note: Mom reports breasts are feeling much fuller this morning. Encouraged to pump q 2-3 hours to soften breasts. Mom pumped and obtained about 20 cc's whitish milk. Discussed pump or home- has WIC . Reviewed WIC loaner and mom agreeable- will fill out paperwork for DC tomorrow. Reviewed engorgement prevention and treatment. No further questions at present. To call prn  Patient Name: Rebecca Meyers Date: 02/27/2015 Reason for consult: Follow-up assessment;NICU baby   Maternal Data Does the patient have breastfeeding experience prior to this delivery?: No  Feeding   LATCH Score/Interventions       Type of Nipple: Flat  Comfort (Breast/Nipple): Filling, red/small blisters or bruises, mild/mod discomfort  Problem noted: Filling Interventions (Filling): Double electric pump Interventions (Mild/moderate discomfort): Hand massage;Hand expression  Hold (Positioning): Assistance needed to correctly position infant at breast and maintain latch.     Lactation Tools Discussed/Used     Consult Status      Pamelia Hoit 02/27/2015, 10:18 AM

## 2015-02-27 NOTE — Discharge Summary (Signed)
Obstetric Discharge Summary Reason for Admission: abruption Prenatal Procedures: NST Intrapartum Procedures: cesarean: low cervical, transverse Postpartum Procedures: none Complications-Operative and Postpartum: none HEMOGLOBIN  Date Value Ref Range Status  02/25/2015 9.4* 12.0 - 15.0 g/dL Final  16/04/9603 54.0 g/dL Final   HCT  Date Value Ref Range Status  02/25/2015 28.5* 36.0 - 46.0 % Final    Discharge Diagnoses: Antepartum bleeding and abruption at 35 weeks  Discharge Information: Date: 02/27/2015 Activity: pelvic rest Diet: routine Medications: Iron and Percocet Condition: stable Instructions: refer to practice specific booklet Discharge to: home Follow-up Information    Follow up with CALLAHAN, SIDNEY, DO In 4 weeks.   Specialty:  Obstetrics and Gynecology   Contact information:   [address] [place] [address] [phone]       Newborn Data: Live born female  Birth Weight: 5 lb 6.4 oz (2450 g) APGAR: 8, 8  Home with mother.  Rebecca Meyers A 02/27/2015, 8:16 AM

## 2015-02-27 NOTE — Progress Notes (Signed)
  Patient is eating, ambulating, voiding.  Pain control is good.  Filed Vitals:   02/26/15 1200 02/26/15 1736 02/26/15 2207 02/27/15 0544  BP: 132/79 133/75 120/69 146/79  Pulse: 57 62 66 58  Temp: 98.3 F (36.8 C) 98.4 F (36.9 C) 98.5 F (36.9 C) 98.2 F (36.8 C)  TempSrc: Oral Oral Oral Oral  Resp: Height:      Weight:      SpO2: 98% 100% 98% 99%    lungs:   clear to auscultation cor:    RRR Abdomen:  soft, appropriate tenderness, incisions intact and without erythema or exudate ex:    no cords   Lab Results  Component Value Date   WBC 17.7* 02/25/2015   HGB 9.4* 02/25/2015   HCT 28.5* 02/25/2015   MCV 92.8 02/25/2015   PLT 271 02/25/2015    --/--/O POS, O POS (08/09 0530)/RI  A/P    Post operative day 2.  Routine post op and postpartum care.  Expect d/c routine.  Percocet for pain control.

## 2015-02-28 LAB — TYPE AND SCREEN
ABO/RH(D): O POS
ANTIBODY SCREEN: NEGATIVE
UNIT DIVISION: 0
Unit division: 0

## 2015-02-28 NOTE — Lactation Note (Signed)
This note was copied from the chart of Rebecca Meyers. Lactation Consultation Note  Mom's breasts are very full, bordering on engorgement.  Increased flange size to a #30.  Expressed about 50 ml. Discussed laying flat and massaging toward the axilla to help drain lymph tissue.  Encouraged regular expression, hand expression and ice to the breast.  Here tissue has some erythema related to milk coming to volume.  Plans [facility] loaner.  Patient Name: Rebecca Deniece Rankin [id number] Date: 02/28/2015     Maternal Data    Feeding Feeding Type: Breast Milk with Formula added Length of feed: 60 min  LATCH Score/Interventions                      Lactation Tools Discussed/Used     Consult Status      Soyla Dryer 02/28/2015, 10:31 AM

## 2015-02-28 NOTE — Progress Notes (Signed)
  Patient is eating, ambulating, voiding.  Pain control is good.  Filed Vitals:   02/27/15 1200 02/27/15 1737 02/27/15 2246 02/28/15 0622  BP: 128/88 146/82 120/64 166/87  Pulse: 62 70 66 57  Temp: 98.3 F (36.8 C) 98.3 F (36.8 C) 98.3 F (36.8 C) 98.2 F (36.8 C)  TempSrc: Oral Oral Oral Oral  Resp: Height:      Weight:      SpO2: 99% 100% 99% 98%    lungs:   clear to auscultation cor:    RRR Abdomen:  soft, appropriate tenderness, incisions intact and without erythema or exudate ex:    no cords   Lab Results  Component Value Date   WBC 17.7* 02/25/2015   HGB 9.4* 02/25/2015   HCT 28.5* 02/25/2015   MCV 92.8 02/25/2015   PLT 271 02/25/2015    --/--/O POS, O POS (08/09 0530)/RI  A/P    Post operative day 3.  Routine post op and postpartum care.  Expect d/c today.  Percocet for pain control.

## 2015-03-16 ENCOUNTER — Inpatient Hospital Stay (HOSPITAL_COMMUNITY): Admission: RE | Admit: 2015-03-16 | Payer: Medicaid Other | Source: Ambulatory Visit

## 2015-09-27 ENCOUNTER — Emergency Department (HOSPITAL_COMMUNITY): Payer: Self-pay

## 2015-09-27 ENCOUNTER — Encounter (HOSPITAL_COMMUNITY): Payer: Self-pay | Admitting: *Deleted

## 2015-09-27 ENCOUNTER — Emergency Department (HOSPITAL_COMMUNITY)
Admission: EM | Admit: 2015-09-27 | Discharge: 2015-09-27 | Disposition: A | Discharge status: Home / Self Care | Payer: Self-pay | Attending: Emergency Medicine | Admitting: Emergency Medicine

## 2015-09-27 DIAGNOSIS — S8012XA Contusion of left lower leg, initial encounter: Secondary | ICD-10-CM | POA: Insufficient documentation

## 2015-09-27 DIAGNOSIS — Y998 Other external cause status: Secondary | ICD-10-CM | POA: Insufficient documentation

## 2015-09-27 DIAGNOSIS — J45909 Unspecified asthma, uncomplicated: Secondary | ICD-10-CM | POA: Insufficient documentation

## 2015-09-27 DIAGNOSIS — Y9289 Other specified places as the place of occurrence of the external cause: Secondary | ICD-10-CM | POA: Insufficient documentation

## 2015-09-27 DIAGNOSIS — Y9389 Activity, other specified: Secondary | ICD-10-CM | POA: Insufficient documentation

## 2015-09-27 DIAGNOSIS — W1839XA Other fall on same level, initial encounter: Secondary | ICD-10-CM | POA: Insufficient documentation

## 2015-09-27 DIAGNOSIS — F17210 Nicotine dependence, cigarettes, uncomplicated: Secondary | ICD-10-CM | POA: Insufficient documentation

## 2015-09-27 DIAGNOSIS — Z79899 Other long term (current) drug therapy: Secondary | ICD-10-CM | POA: Insufficient documentation

## 2015-09-27 DIAGNOSIS — S93402A Sprain of unspecified ligament of left ankle, initial encounter: Secondary | ICD-10-CM | POA: Insufficient documentation

## 2015-09-27 DIAGNOSIS — Z880 Allergy status to penicillin: Secondary | ICD-10-CM | POA: Insufficient documentation

## 2015-09-27 MED ORDER — HYDROCODONE-ACETAMINOPHEN 5-325 MG PO TABS: 2.0000 | ORAL_TABLET | Freq: Once | ORAL | Status: DC

## 2015-09-27 MED ORDER — HYDROCODONE-ACETAMINOPHEN 5-325 MG PO TABS
2.0000 | ORAL_TABLET | ORAL | Status: AC | PRN
Start: 2015-09-27 — End: ?

## 2015-09-27 MED ORDER — HYDROCODONE-ACETAMINOPHEN 5-325 MG PO TABS
2.0000 | ORAL_TABLET | Freq: Once | ORAL | Status: AC
  Administered 2015-09-27: 2 via ORAL
  Filled 2015-09-27: qty 2

## 2015-09-27 NOTE — ED Provider Notes (Signed)
CSN: [id number]     Arrival date & time 09/27/15  1929 History  By signing my name below, I, Doreatha Martin, attest that this documentation has been prepared under the direction and in the presence of Marlia Schewe K. Delcenia Inman, PA-C. Electronically Signed: Doreatha Martin, ED Scribe. 09/27/2015. 8:58 PM.    Chief Complaint  Patient presents with  . Ankle Pain   The history is provided by the patient. No language interpreter was used.    HPI Comments: Rebecca Meyers is a 35 y.o. female otherwise healthy who presents to the Emergency Department complaining of moderate left dorsal ankle pain with radiation up the calf with associated ankle swelling s/p mechanical fall that occurred this evening. She states she was on her son's hoverboard and fell off, landing on her left foot. Pt states she heard a popping sound upon landing. No LOC or head injury. Pt denies taking OTC medications at home to improve symptoms. She reports mild relief with ice. Pt is allergic to penicillin. Pt denies numbness, paresthesia, additional injuries.   Past Medical History  Diagnosis Date  . Asthma     allergy related   Past Surgical History  Procedure Laterality Date  . Cesarean section    . Wisdom tooth extraction Bilateral 2007    all wisdom teeth extracted  . Cesarean section N/A 02/25/2015    Procedure: CESAREAN SECTION WITH BILATERAL TUBAL LIGATION;  Surgeon: Philip Aspen, DO;  Location: [facility];  Service: Obstetrics;  Laterality: N/A;   Family History  Problem Relation Age of Onset  . Hypertension Father   . Diabetes Maternal Grandmother   . Heart disease Maternal Grandmother   . Cancer Maternal Grandfather   . Cancer Paternal Grandmother   . Diabetes Paternal Grandfather   . Heart disease Paternal Grandfather   . Stroke Paternal Grandfather    Social History  Substance Use Topics  . Smoking status: Current Some Day Smoker -- 0.50 packs/day    Types: Cigarettes  . Smokeless tobacco: Never Used  . Alcohol Use:  No   OB History    Gravida Para Term Preterm AB TAB SAB Ectopic Multiple Living        Obstetric Comments   Patient on buprenorphine      Review of Systems  Musculoskeletal: Positive for joint swelling and arthralgias.  Neurological: Negative for numbness.  All other systems reviewed and are negative.  Allergies  Penicillins  Home Medications   Prior to Admission medications   Medication Sig Start Date End Date Taking? Authorizing Provider  albuterol (PROVENTIL HFA;VENTOLIN HFA) 108 (90 BASE) MCG/ACT inhaler Inhale 2 puffs into the lungs every 6 (six) hours as needed. For shortness of breath    Yes Historical Provider, MD  fluticasone (FLONASE) 50 MCG/ACT nasal spray Place 2 sprays into both nostrils daily as needed for allergies.   Yes Historical Provider, MD  ibuprofen (ADVIL,MOTRIN) 200 MG tablet Take 400-800 mg by mouth every 6 (six) hours as needed (for pain.).   Yes Historical Provider, MD  loratadine (CLARITIN) 10 MG tablet Take 10 mg by mouth daily as needed for allergies.   Yes Historical Provider, MD  oxyCODONE 10 MG TABS Take 1 tablet (10 mg total) by mouth every 4 (four) hours as needed for breakthrough pain (may have in addition to Percocet). Patient not taking: Reported on 09/27/2015 02/27/15   Carrington Clamp, MD   BP 131/80 mmHg  Pulse 79  Temp(Src) 98.8  F (37.1 C) (Oral)  Resp 18  SpO2 98%  LMP 09/06/2015 Physical Exam  Constitutional: She is oriented to person, place, and time. She appears well-developed and well-nourished.  HENT:  Head: Normocephalic and atraumatic.  Eyes: Conjunctivae and EOM are normal. Pupils are equal, round, and reactive to light.  Neck: Normal range of motion. Neck supple.  Cardiovascular: Normal rate.   Pulmonary/Chest: Effort normal. No respiratory distress.  Abdominal: She exhibits no distension.  Musculoskeletal: Normal range of motion. She exhibits tenderness.  Tender along achilles, but has FROM along  the achilles. Achilles is palpable and intact.   Neurological: She is alert and oriented to person, place, and time.  Skin: Skin is warm and dry.  Psychiatric: She has a normal mood and affect. Her behavior is normal.  Nursing note and vitals reviewed.   ED Course  Procedures (including critical care time) DIAGNOSTIC STUDIES: Oxygen Saturation is 98% on RA, normal by my interpretation.    COORDINATION OF CARE: 8:54 PM Discussed treatment plan with pt at bedside which includes XR and pt agreed to plan.   Imaging Review Dg Tibia/fibula Left  09/27/2015  CLINICAL DATA:  Larey SeatFell off upper port.  Leg pain. EXAM: LEFT TIBIA AND FIBULA - 2 VIEW COMPARISON:  None. FINDINGS: Two-view exam shows no evidence for fracture involving the tibia or fibula. No worrisome lytic or sclerotic osseous abnormality. IMPRESSION: No evidence for an acute fracture in the tibia or fibula. If there is concern for ankle injury, dedicated ankle study recommended. Electronically Signed   By: Kennith CenterEric  Mansell M.D.   On: 09/27/2015 21:21   I have personally reviewed and evaluated these images as part of my medical decision-making.   MDM  .a Final diagnoses:  Sprain of ankle, left, initial encounter  Contusion, lower leg, left, initial encounter   Pt presents to the ED for evaluation of achilles tenderness s/p mechanical fall that occurred this evening. Patient X-Ray negative for obvious fracture or dislocation.  Pt advised to follow up with orthopedics. Patient given aso while in ED, conservative therapy recommended and discussed. Patient will be discharged home & is agreeable with above plan. Returns precautions discussed. Pt appears safe for discharge.  An After Visit Summary was printed and given to the patient. Meds ordered this encounter  Medications  . ibuprofen (ADVIL,MOTRIN) 200 MG tablet    Sig: Take 400-800 mg by mouth every 6 (six) hours as needed (for pain.).  Marland Kitchen. fluticasone (FLONASE) 50 MCG/ACT nasal spray     Sig: Place 2 sprays into both nostrils daily as needed for allergies.  Marland Kitchen. loratadine (CLARITIN) 10 MG tablet    Sig: Take 10 mg by mouth daily as needed for allergies.  Marland Kitchen. HYDROcodone-acetaminophen (NORCO/VICODIN) 5-325 MG per tablet 2 tablet    Sig:   . HYDROcodone-acetaminophen (NORCO/VICODIN) 5-325 MG tablet    Sig: Take 2 tablets by mouth every 4 (four) hours as needed.    Dispense:  10 tablet    Refill:  0    Order Specific Question:  Supervising Provider    Answer:  Jerelyn ScottLINKER, MARTHA [[id number]]    Lonia SkinnerLeslie K WilliamsSofia, PA-C 09/27/15 2132  Arby BarretteMarcy Pfeiffer, MD 09/30/15 1538

## 2015-09-27 NOTE — ED Notes (Signed)
Pt was on her son's hoverboard and fell off landing on her left foot. She felt a "pop" sensation and ankle is noticeable swollen. Pt with ice pack in place.

## 2015-09-27 NOTE — Discharge Instructions (Signed)
Ankle Sprain °An ankle sprain is an injury to the strong, fibrous tissues (ligaments) that hold the bones of your ankle joint together.  °CAUSES °An ankle sprain is usually caused by a fall or by twisting your ankle. Ankle sprains most commonly occur when you step on the outer edge of your foot, and your ankle turns inward. People who participate in sports are more prone to these types of injuries.  °SYMPTOMS  °· Pain in your ankle. The pain may be present at rest or only when you are trying to stand or walk. °· Swelling. °· Bruising. Bruising may develop immediately or within 1 to 2 days after your injury. °· Difficulty standing or walking, particularly when turning corners or changing directions. °DIAGNOSIS  °Your caregiver will ask you details about your injury and perform a physical exam of your ankle to determine if you have an ankle sprain. During the physical exam, your caregiver will press on and apply pressure to specific areas of your foot and ankle. Your caregiver will try to move your ankle in certain ways. An X-ray exam may be done to be sure a bone was not broken or a ligament did not separate from one of the bones in your ankle (avulsion fracture).  °TREATMENT  °Certain types of braces can help stabilize your ankle. Your caregiver can make a recommendation for this. Your caregiver may recommend the use of medicine for pain. If your sprain is severe, your caregiver may refer you to a surgeon who helps to restore function to parts of your skeletal system (orthopedist) or a physical therapist. °HOME CARE INSTRUCTIONS  °· Apply ice to your injury for 1-2 days or as directed by your caregiver. Applying ice helps to reduce inflammation and pain. °· Put ice in a plastic bag. °· Place a towel between your skin and the bag. °· Leave the ice on for 15-20 minutes at a time, every 2 hours while you are awake. °· Only take over-the-counter or prescription medicines for pain, discomfort, or fever as directed by  your caregiver. °· Elevate your injured ankle above the level of your heart as much as possible for 2-3 days. °· If your caregiver recommends crutches, use them as instructed. Gradually put weight on the affected ankle. Continue to use crutches or a cane until you can walk without feeling pain in your ankle. °· If you have a plaster splint, wear the splint as directed by your caregiver. Do not rest it on anything harder than a pillow for the first 24 hours. Do not put weight on it. Do not get it wet. You may take it off to take a shower or bath. °· You may have been given an elastic bandage to wear around your ankle to provide support. If the elastic bandage is too tight (you have numbness or tingling in your foot or your foot becomes cold and blue), adjust the bandage to make it comfortable. °· If you have an air splint, you may blow more air into it or let air out to make it more comfortable. You may take your splint off at night and before taking a shower or bath. Wiggle your toes in the splint several times per day to decrease swelling. °SEEK MEDICAL CARE IF:  °· You have rapidly increasing bruising or swelling. °· Your toes feel extremely cold or you lose feeling in your foot. °· Your pain is not relieved with medicine. °SEEK IMMEDIATE MEDICAL CARE IF: °· Your toes are numb or blue. °·   You have severe pain that is increasing. °MAKE SURE YOU:  °· Understand these instructions. °· Will watch your condition. °· Will get help right away if you are not doing well or get worse. °  °This information is not intended to replace advice given to you by your health care provider. Make sure you discuss any questions you have with your health care provider. °  °Document Released: 07/04/2005 Document Revised: 07/25/2014 Document Reviewed: 07/16/2011 °Elsevier Interactive Patient Education ©2016 [employer] °RICE for Routine Care of Injuries °The routine care of many injuries includes rest, ice, compression, and elevation  (RICE therapy). RICE therapy is often recommended for injuries to soft tissues, such as a muscle strain, ligament injuries, bruises, and overuse injuries. It can also be used for some bony injuries. Using RICE therapy can help to relieve pain, lessen swelling, and enable your body to heal. °Rest °Rest is required to allow your body to heal. This usually involves reducing your normal activities and avoiding use of the injured part of your body. Generally, you can return to your normal activities when you are comfortable and have been given permission by your health care provider. °Ice °Icing your injury helps to keep the swelling down, and it lessens pain. Do not apply ice directly to your skin. °· Put ice in a plastic bag. °· Place a towel between your skin and the bag. °· Leave the ice on for 20 minutes, 2-3 times a day. °Do this for as long as you are directed by your health care provider. °Compression °Compression means putting pressure on the injured area. Compression helps to keep swelling down, gives support, and helps with discomfort. Compression may be done with an elastic bandage. If an elastic bandage has been applied, follow these general tips: °· Remove and reapply the bandage every 3-4 hours or as directed by your health care provider. °· Make sure the bandage is not wrapped too tightly, because this can cut off circulation. If part of your body beyond the bandage becomes blue, numb, cold, swollen, or more painful, your bandage is most likely too tight. If this occurs, remove your bandage and reapply it more loosely. °· See your health care provider if the bandage seems to be making your problems worse rather than better. °Elevation °Elevation means keeping the injured area raised. This helps to lessen swelling and decrease pain. If possible, your injured area should be elevated at or above the level of your heart or the center of your chest. °WHEN SHOULD I SEEK MEDICAL CARE? °You should seek medical  care if: °· Your pain and swelling continue. °· Your symptoms are getting worse rather than improving. °These symptoms may indicate that further evaluation or further X-rays are needed. Sometimes, X-rays may not show a small broken bone (fracture) until a number of days later. Make a follow-up appointment with your health care provider. °WHEN SHOULD I SEEK IMMEDIATE MEDICAL CARE? °You should seek immediate medical care if: °· You have sudden severe pain at or below the area of your injury. °· You have redness or increased swelling around your injury. °· You have tingling or numbness at or below the area of your injury that does not improve after you remove the elastic bandage. °  °This information is not intended to replace advice given to you by your health care provider. Make sure you discuss any questions you have with your health care provider. °  °Document Released: 10/16/2000 Document Revised: 03/25/2015 Document Reviewed: 06/11/2014 °Elsevier Interactive Patient Education ©2016 [employer] ° °

## 2015-12-29 ENCOUNTER — Emergency Department (HOSPITAL_COMMUNITY): Payer: Self-pay

## 2015-12-29 ENCOUNTER — Emergency Department (HOSPITAL_COMMUNITY)
Admission: EM | Admit: 2015-12-29 | Discharge: 2015-12-29 | Disposition: A | Discharge status: Home / Self Care | Payer: Self-pay | Attending: Emergency Medicine | Admitting: Emergency Medicine

## 2015-12-29 ENCOUNTER — Encounter (HOSPITAL_COMMUNITY): Payer: Self-pay

## 2015-12-29 DIAGNOSIS — F17210 Nicotine dependence, cigarettes, uncomplicated: Secondary | ICD-10-CM | POA: Insufficient documentation

## 2015-12-29 DIAGNOSIS — Z7951 Long term (current) use of inhaled steroids: Secondary | ICD-10-CM | POA: Insufficient documentation

## 2015-12-29 DIAGNOSIS — Z791 Long term (current) use of non-steroidal anti-inflammatories (NSAID): Secondary | ICD-10-CM | POA: Insufficient documentation

## 2015-12-29 DIAGNOSIS — R1013 Epigastric pain: Secondary | ICD-10-CM | POA: Insufficient documentation

## 2015-12-29 DIAGNOSIS — R112 Nausea with vomiting, unspecified: Secondary | ICD-10-CM | POA: Insufficient documentation

## 2015-12-29 DIAGNOSIS — J45909 Unspecified asthma, uncomplicated: Secondary | ICD-10-CM | POA: Insufficient documentation

## 2015-12-29 LAB — URINALYSIS, ROUTINE W REFLEX MICROSCOPIC
BILIRUBIN URINE: NEGATIVE
Glucose, UA: NEGATIVE mg/dL
Hgb urine dipstick: NEGATIVE
Ketones, ur: NEGATIVE mg/dL
Leukocytes, UA: NEGATIVE
NITRITE: NEGATIVE
Protein, ur: NEGATIVE mg/dL
SPECIFIC GRAVITY, URINE: 1.025 (ref 1.005–1.030)
pH: 6 (ref 5.0–8.0)

## 2015-12-29 LAB — CBC
HEMATOCRIT: 41.6 % (ref 36.0–46.0)
HEMOGLOBIN: 14.4 g/dL (ref 12.0–15.0)
MCH: 30.8 pg (ref 26.0–34.0)
MCHC: 34.6 g/dL (ref 30.0–36.0)
MCV: 88.9 fL (ref 78.0–100.0)
Platelets: 444 10*3/uL — ABNORMAL HIGH (ref 150–400)
RBC: 4.68 MIL/uL (ref 3.87–5.11)
RDW: 13 % (ref 11.5–15.5)
WBC: 9.6 10*3/uL (ref 4.0–10.5)

## 2015-12-29 LAB — LIPASE, BLOOD: Lipase: 22 U/L (ref 11–51)

## 2015-12-29 LAB — COMPREHENSIVE METABOLIC PANEL
ALBUMIN: 4.2 g/dL (ref 3.5–5.0)
ALT: 14 U/L (ref 14–54)
ANION GAP: 7 (ref 5–15)
AST: 17 U/L (ref 15–41)
Alkaline Phosphatase: 74 U/L (ref 38–126)
BILIRUBIN TOTAL: 0.4 mg/dL (ref 0.3–1.2)
BUN: 10 mg/dL (ref 6–20)
CHLORIDE: 105 mmol/L (ref 101–111)
CO2: 27 mmol/L (ref 22–32)
Calcium: 9.4 mg/dL (ref 8.9–10.3)
Creatinine, Ser: 0.65 mg/dL (ref 0.44–1.00)
GFR calc Af Amer: >60 mL/min (ref >60)
GFR calc non Af Amer: >60 mL/min (ref >60)
GLUCOSE: 95 mg/dL (ref 65–99)
POTASSIUM: 3.9 mmol/L (ref 3.5–5.1)
SODIUM: 139 mmol/L (ref 135–145)
TOTAL PROTEIN: 7.9 g/dL (ref 6.5–8.1)

## 2015-12-29 LAB — I-STAT BETA HCG BLOOD, ED (MC, WL, AP ONLY): I-stat hCG, quantitative: <5 m[IU]/mL (ref <5)

## 2015-12-29 MED ORDER — SODIUM CHLORIDE 0.9 % IV BOLUS (SEPSIS)
1000.0000 mL | Freq: Once | INTRAVENOUS | Status: AC
  Administered 2015-12-29: 1000 mL via INTRAVENOUS

## 2015-12-29 MED ORDER — METOCLOPRAMIDE HCL 5 MG/ML IJ SOLN
10.0000 mg | Freq: Once | INTRAMUSCULAR | Status: AC
  Administered 2015-12-29: 10 mg via INTRAVENOUS
  Filled 2015-12-29: qty 2

## 2015-12-29 MED ORDER — OMEPRAZOLE 20 MG PO CPDR: 20.0000 mg | DELAYED_RELEASE_CAPSULE | Freq: Every day | ORAL | Status: AC

## 2015-12-29 MED ORDER — IOPAMIDOL (ISOVUE-300) INJECTION 61%
100.0000 mL | Freq: Once | INTRAVENOUS | Status: AC | PRN
  Administered 2015-12-29: 100 mL via INTRAVENOUS

## 2015-12-29 MED ORDER — ONDANSETRON HCL 4 MG PO TABS: 4.0000 mg | ORAL_TABLET | Freq: Four times a day (QID) | ORAL | Status: AC

## 2015-12-29 MED ORDER — FAMOTIDINE IN NACL 20-0.9 MG/50ML-% IV SOLN
20.0000 mg | Freq: Once | INTRAVENOUS | Status: AC
Start: 2015-12-29 — End: 2015-12-29
  Administered 2015-12-29: 20 mg via INTRAVENOUS
  Filled 2015-12-29: qty 50

## 2015-12-29 NOTE — ED Provider Notes (Signed)
CSN: [id number]     Arrival date & time 12/29/15  1547 History   First MD Initiated Contact with Patient 12/29/15 1901     Chief Complaint  Patient presents with  . Abdominal Pain  . Emesis   HPI  Rebecca Meyers is a 35 y.o. female PMH significant for asthma presenting with abdominal pain and emesis 1 day. She states she took a "vitamin" that she thought was for weight loss yesterday, and she began having epigastric abdominal pain that she describes as constant, burning, non-radiating, "felt like a ball," 9/10 pain scale, alleviated with Zantac. She admits to decreased appetite over the last few months and feels that her pain began after she delivered her son 10 months ago. She consulted a physician that she works with who told her to come to the ER to r/o obstruction, as patient has been on opoids, has not had a bowel movement for at least one week, and now has nausea/vomiting. She denies fevers, chills, diarrhea, pain association with foods/bowel movements.   Past Medical History  Diagnosis Date  . Asthma     allergy related   Past Surgical History  Procedure Laterality Date  . Cesarean section    . Wisdom tooth extraction Bilateral 2007    all wisdom teeth extracted  . Cesarean section N/A 02/25/2015    Procedure: CESAREAN SECTION WITH BILATERAL TUBAL LIGATION;  Surgeon: Philip AspenSidney Callahan, DO;  Location: [facility];  Service: Obstetrics;  Laterality: N/A;   Family History  Problem Relation Age of Onset  . Hypertension Father   . Diabetes Maternal Grandmother   . Heart disease Maternal Grandmother   . Cancer Maternal Grandfather   . Cancer Paternal Grandmother   . Diabetes Paternal Grandfather   . Heart disease Paternal Grandfather   . Stroke Paternal Grandfather    Social History  Substance Use Topics  . Smoking status: Current Some Day Smoker -- 0.50 packs/day    Types: Cigarettes  . Smokeless tobacco: Never Used  . Alcohol Use: No   OB History    Gravida Para Term Preterm  AB TAB SAB Ectopic Multiple Living   3 2 0 2 1 1 0 0 0 1       Obstetric Comments   Patient on buprenorphine      Review of Systems  Ten systems are reviewed and are negative for acute change except as noted in the HPI  Allergies  Penicillins  Home Medications   Prior to Admission medications   Medication Sig Start Date End Date Taking? Authorizing Provider  albuterol (PROVENTIL HFA;VENTOLIN HFA) 108 (90 BASE) MCG/ACT inhaler Inhale 2 puffs into the lungs every 6 (six) hours as needed. For shortness of breath     Historical Provider, MD  fluticasone (FLONASE) 50 MCG/ACT nasal spray Place 2 sprays into both nostrils daily as needed for allergies.    Historical Provider, MD  HYDROcodone-acetaminophen (NORCO/VICODIN) 5-325 MG tablet Take 2 tablets by mouth every 4 (four) hours as needed. 09/27/15   Elson AreasLeslie K Sofia, PA-C  ibuprofen (ADVIL,MOTRIN) 200 MG tablet Take 400-800 mg by mouth every 6 (six) hours as needed (for pain.).    Historical Provider, MD  loratadine (CLARITIN) 10 MG tablet Take 10 mg by mouth daily as needed for allergies.    Historical Provider, MD  oxyCODONE 10 MG TABS Take 1 tablet (10 mg total) by mouth every 4 (four) hours as needed for breakthrough pain (may have in addition to Percocet). Patient not taking: Reported on  09/27/2015 02/27/15   Carrington Clamp, MD   BP 141/84 mmHg  Pulse 67  Temp(Src) 98.3 F (36.8 C) (Oral)  Resp 19  SpO2 99%  LMP 12/15/2015 Physical Exam  Constitutional: She appears well-developed and well-nourished. No distress.  HENT:  Head: Normocephalic and atraumatic.  Mouth/Throat: Oropharynx is clear and moist. No oropharyngeal exudate.  Eyes: Conjunctivae are normal. Pupils are equal, round, and reactive to light. Right eye exhibits no discharge. Left eye exhibits no discharge. No scleral icterus.  Neck: No tracheal deviation present.  Cardiovascular: Normal rate, regular rhythm, normal heart sounds and intact distal pulses.  Exam reveals  no gallop and no friction rub.   No murmur heard. Pulmonary/Chest: Effort normal and breath sounds normal. No respiratory distress. She has no wheezes. She has no rales. She exhibits no tenderness.  Abdominal: Soft. Bowel sounds are normal. She exhibits no distension and no mass. There is tenderness. There is no rebound and no guarding.  Epigastric tenderness  Musculoskeletal: She exhibits no edema.  Lymphadenopathy:    She has no cervical adenopathy.  Neurological: She is alert. Coordination normal.  Skin: Skin is warm and dry. No rash noted. She is not diaphoretic. No erythema.  Psychiatric: She has a normal mood and affect. Her behavior is normal.  Nursing note and vitals reviewed.   ED Course  Procedures Labs Review Labs Reviewed  CBC - Abnormal; Notable for the following:    Platelets 444 (*)    All other components within normal limits  URINALYSIS, ROUTINE W REFLEX MICROSCOPIC (NOT AT [facility]) - Abnormal; Notable for the following:    Color, Urine AMBER (*)    APPearance CLOUDY (*)    All other components within normal limits  LIPASE, BLOOD  COMPREHENSIVE METABOLIC PANEL  I-STAT BETA HCG BLOOD, ED ([facility], [facility], [facility] ONLY)   Imaging Review Ct Abdomen Pelvis W Contrast  12/29/2015  CLINICAL DATA:  Acute onset of generalized abdominal pain and vomiting. Initial encounter. EXAM: CT ABDOMEN AND PELVIS WITH CONTRAST TECHNIQUE: Multidetector CT imaging of the abdomen and pelvis was performed using the standard protocol following bolus administration of intravenous contrast. CONTRAST:  ISOVUE-300 IOPAMIDOL (ISOVUE-300) INJECTION 61% COMPARISON:  Pelvic ultrasound performed 02/24/2015 FINDINGS: Mild bibasilar atelectasis is noted. The liver and spleen are unremarkable in appearance. The gallbladder is within normal limits. The pancreas and adrenal glands are unremarkable. The kidneys are unremarkable in appearance. There is no evidence of hydronephrosis. No renal or ureteral stones are seen.  No perinephric stranding is appreciated. No free fluid is identified. The small bowel is unremarkable in appearance. The stomach is within normal limits. No acute vascular abnormalities are seen. The appendix is normal in caliber, without evidence of appendicitis. The colon is unremarkable in appearance. The bladder is mildly distended and grossly unremarkable. The uterus is unremarkable in appearance. The ovaries are relatively symmetric. No suspicious adnexal masses are seen. Bilateral tubal ligation clips are noted. No inguinal lymphadenopathy is seen. No acute osseous abnormalities are identified. A limbus vertebra is incidentally noted at the superior endplate of L4. IMPRESSION: 1. No acute abnormality seen within the abdomen or pelvis. 2. Mild bibasilar atelectasis noted. 3. Limbus vertebra incidentally noted at the superior endplate of L4. Electronically Signed   By: Roanna Raider M.D.   On: 12/29/2015 21:11   I have personally reviewed and evaluated these images and lab results as part of my medical decision-making.  MDM   Final diagnoses:  Epigastric pain  Nausea and vomiting, vomiting  of unspecified type   Patient nontoxic appearing, VSS. Based on patient history and physical exam, most likely etiologies are gastritis/ PUD vs GERD. Less likely etiologies include pancreatitis, MI.   UA, hcg, lipase, CMP, CBC, CT abdomen/pelvis without acute change.  Patient feeling improved after fluids, PPI and antiemetic. Patient may be safely discharged home with PPI and antiemetic. Discussed reasons for return. Patient to follow-up with primary care provider within one week. Patient in understanding and agreement with the plan.  Melton Krebs, PA-C 01/04/16 1017  Arby Barrette, MD 01/07/16 0030

## 2015-12-29 NOTE — ED Notes (Signed)
Pt states she took 1 vitamin yesterday and started having abdominal pain and emesis.  No further doses.  No fever.  No change in urination or vaginal discharge.  Unknown for last bm.

## 2015-12-29 NOTE — Discharge Instructions (Signed)
Rebecca Meyers,  Nice meeting you! Please follow-up with your primary care provider. Return to the emergency department if you develop increased abdominal pain, are unable to keep foods down, have new/worsening symptoms. You can take magnesium citrate for your constipation. Feel better soon!  S. Lane HackerNicole Arzu Mcgaughey, PA-C Abdominal Pain, Adult Many things can cause abdominal pain. Usually, abdominal pain is not caused by a disease and will improve without treatment. It can often be observed and treated at home. Your health care provider will do a physical exam and possibly order blood tests and X-rays to help determine the seriousness of your pain. However, in many cases, more time must pass before a clear cause of the pain can be found. Before that point, your health care provider may not know if you need more testing or further treatment. HOME CARE INSTRUCTIONS Monitor your abdominal pain for any changes. The following actions may help to alleviate any discomfort you are experiencing:  Only take over-the-counter or prescription medicines as directed by your health care provider.  Do not take laxatives unless directed to do so by your health care provider.  Try a clear liquid diet (broth, tea, or water) as directed by your health care provider. Slowly move to a bland diet as tolerated. SEEK MEDICAL CARE IF:  You have unexplained abdominal pain.  You have abdominal pain associated with nausea or diarrhea.  You have pain when you urinate or have a bowel movement.  You experience abdominal pain that wakes you in the night.  You have abdominal pain that is worsened or improved by eating food.  You have abdominal pain that is worsened with eating fatty foods.  You have a fever. SEEK IMMEDIATE MEDICAL CARE IF:  Your pain does not go away within 2 hours.  You keep throwing up (vomiting).  Your pain is felt only in portions of the abdomen, such as the right side or the left lower portion  of the abdomen.  You pass bloody or black tarry stools. MAKE SURE YOU:  Understand these instructions.  Will watch your condition.  Will get help right away if you are not doing well or get worse.   This information is not intended to replace advice given to you by your health care provider. Make sure you discuss any questions you have with your health care provider.   Document Released: 04/13/2005 Document Revised: 03/25/2015 Document Reviewed: 03/13/2013 Elsevier Interactive Patient Education [employer]2016 Elsevier Inc.

## 2016-04-17 IMAGING — CR DG CHEST 2V
2 series · 2 of 2 positions shown · non-contrast
Comparison: 10/04/2013

CLINICAL DATA: Cough.  Initial encounter.

EXAM:
CHEST  2 VIEW

[w chest pa]
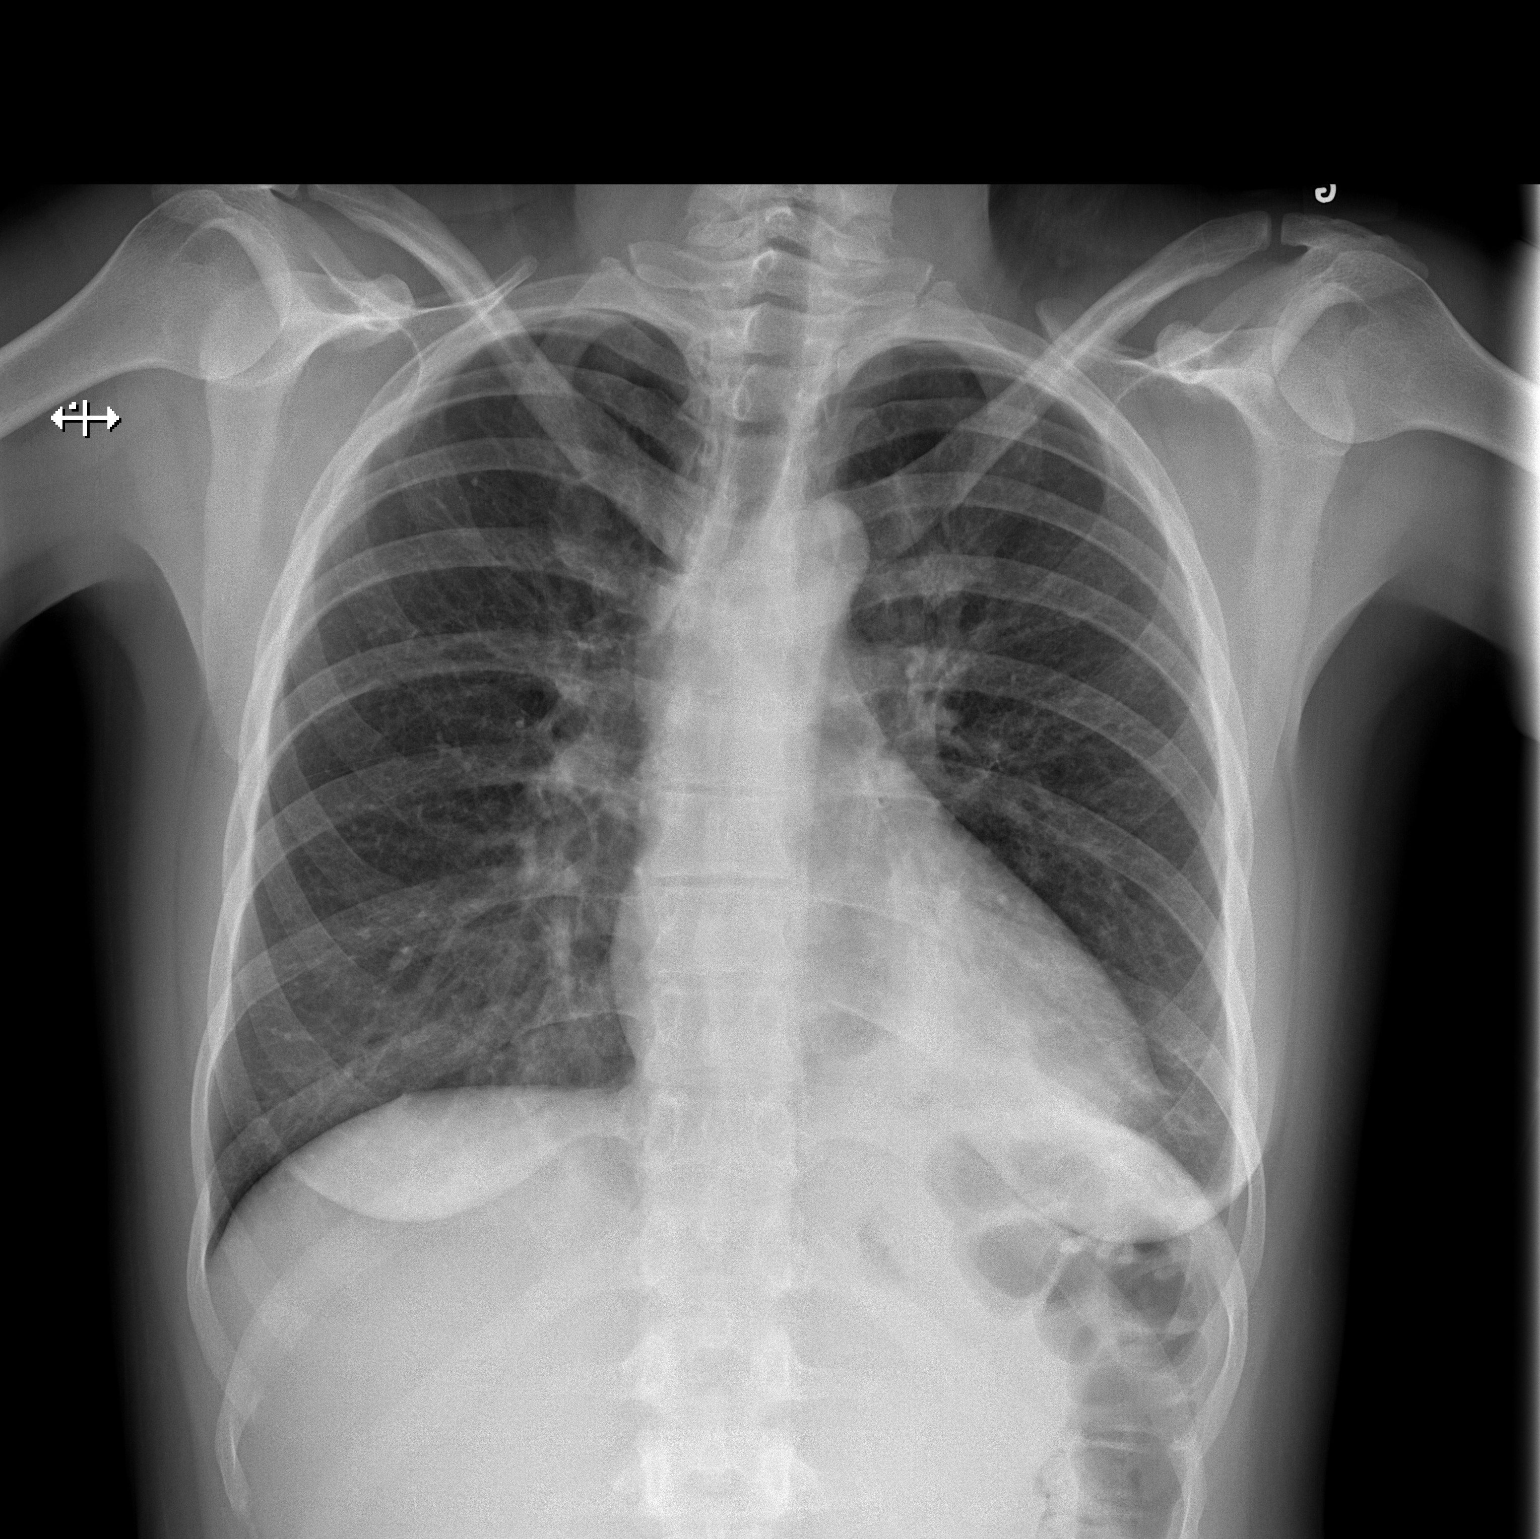

[w chest lat]
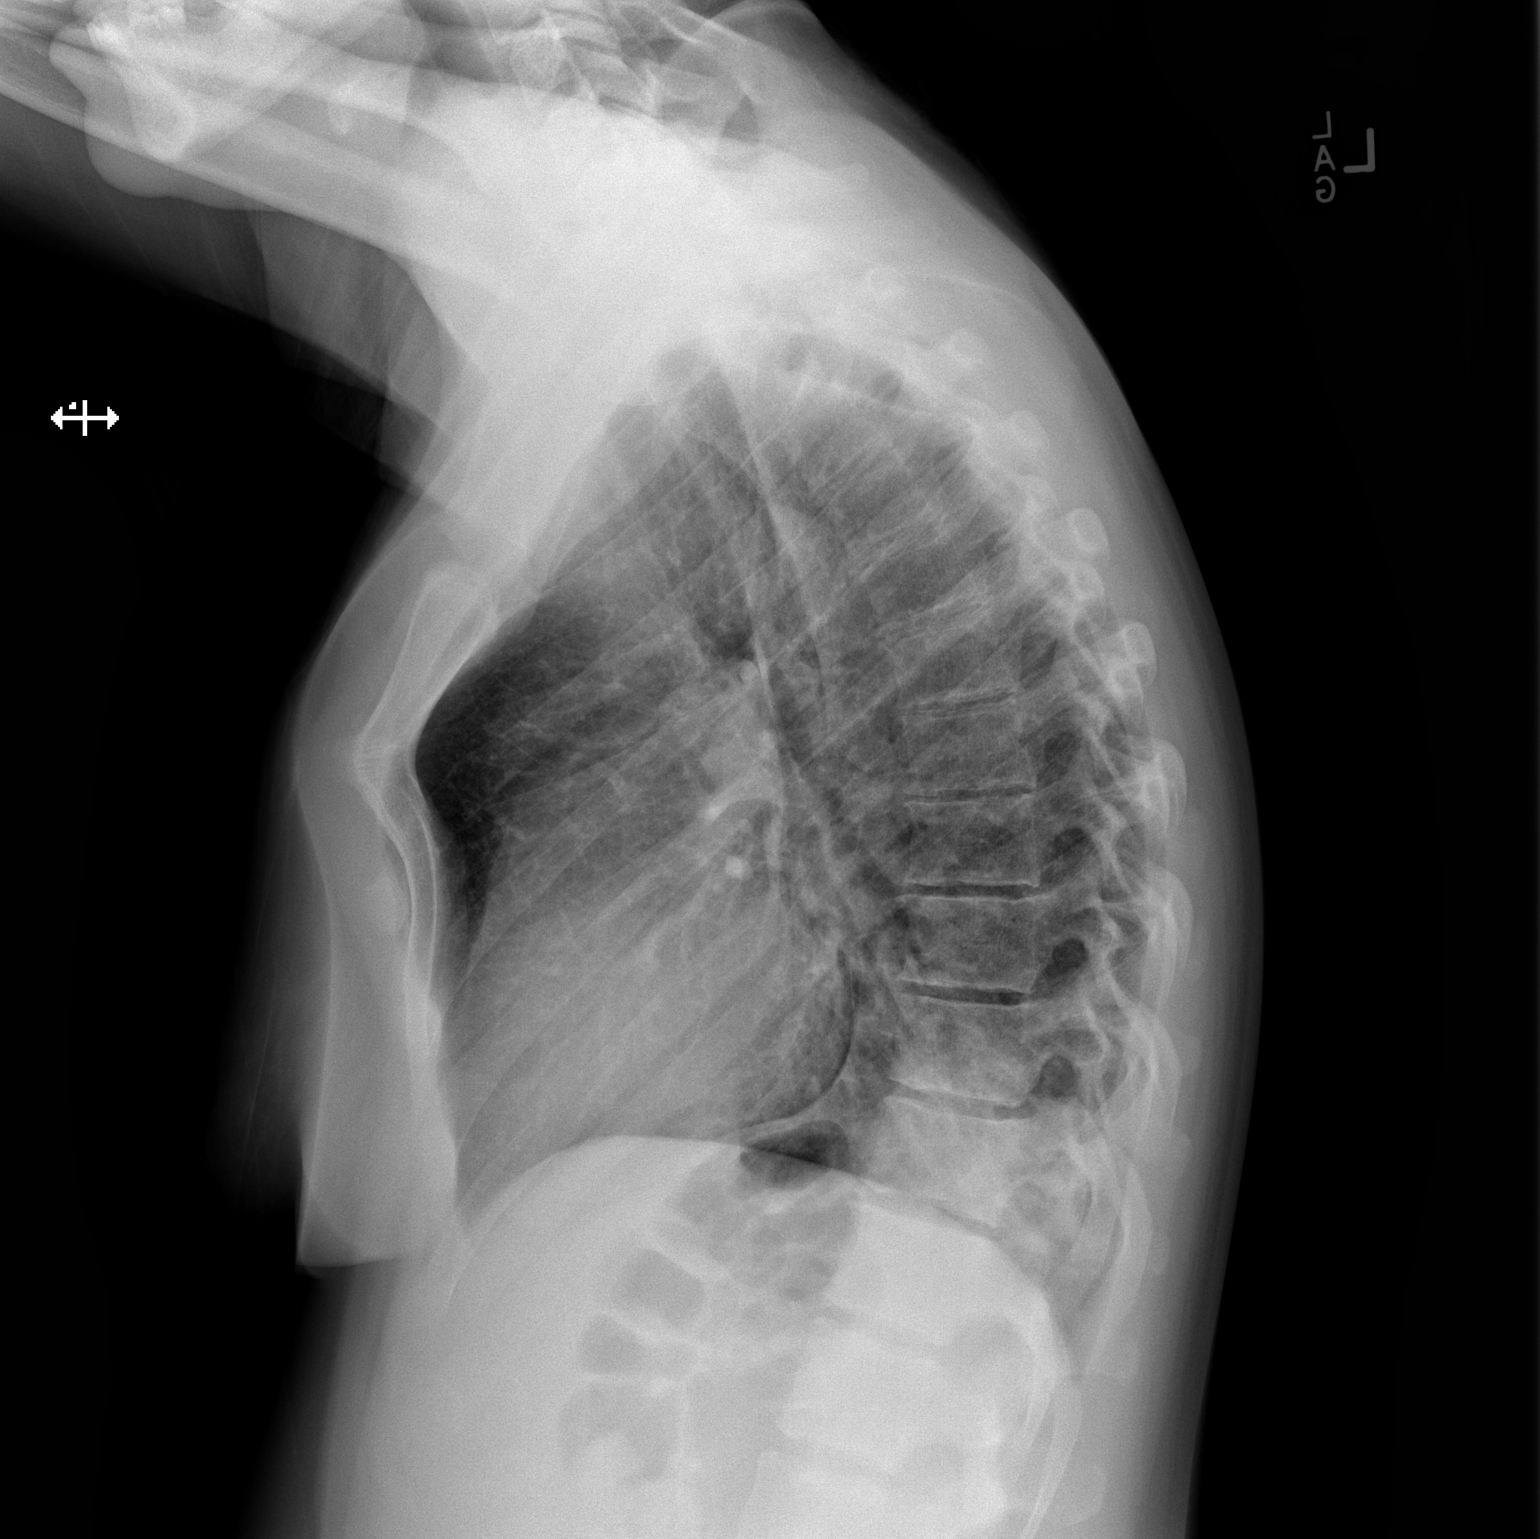

[2 of 2 positions shown; findings below may reference images not displayed]

FINDINGS: Left lower lobe consolidation.  No effusion or cavitation.

Pectus excavatum versus remote sternal body fracture.

Normal heart size and mediastinal contours.
IMPRESSION: Left lower lobe pneumonia.

## 2016-05-10 ENCOUNTER — Emergency Department (HOSPITAL_COMMUNITY): Payer: Medicaid Other

## 2016-05-10 ENCOUNTER — Emergency Department (HOSPITAL_COMMUNITY)
Admission: EM | Admit: 2016-05-10 | Discharge: 2016-05-10 | Disposition: A | Discharge status: Home / Self Care | Payer: Medicaid Other | Attending: Emergency Medicine | Admitting: Emergency Medicine

## 2016-05-10 ENCOUNTER — Encounter (HOSPITAL_COMMUNITY): Payer: Self-pay | Admitting: Emergency Medicine

## 2016-05-10 DIAGNOSIS — R1011 Right upper quadrant pain: Secondary | ICD-10-CM

## 2016-05-10 DIAGNOSIS — F17210 Nicotine dependence, cigarettes, uncomplicated: Secondary | ICD-10-CM | POA: Insufficient documentation

## 2016-05-10 DIAGNOSIS — K219 Gastro-esophageal reflux disease without esophagitis: Secondary | ICD-10-CM | POA: Diagnosis not present

## 2016-05-10 DIAGNOSIS — R1010 Upper abdominal pain, unspecified: Secondary | ICD-10-CM | POA: Diagnosis present

## 2016-05-10 DIAGNOSIS — J45909 Unspecified asthma, uncomplicated: Secondary | ICD-10-CM | POA: Insufficient documentation

## 2016-05-10 LAB — COMPREHENSIVE METABOLIC PANEL
ALT: 15 U/L (ref 14–54)
ANION GAP: 7 (ref 5–15)
AST: 16 U/L (ref 15–41)
Albumin: 4 g/dL (ref 3.5–5.0)
Alkaline Phosphatase: 76 U/L (ref 38–126)
BUN: 11 mg/dL (ref 6–20)
CHLORIDE: 106 mmol/L (ref 101–111)
CO2: 25 mmol/L (ref 22–32)
Calcium: 9.4 mg/dL (ref 8.9–10.3)
Creatinine, Ser: 0.58 mg/dL (ref 0.44–1.00)
GFR calc non Af Amer: >60 mL/min (ref >60)
Glucose, Bld: 89 mg/dL (ref 65–99)
Potassium: 3.3 mmol/L — ABNORMAL LOW (ref 3.5–5.1)
SODIUM: 138 mmol/L (ref 135–145)
Total Bilirubin: 0.6 mg/dL (ref 0.3–1.2)
Total Protein: 7.3 g/dL (ref 6.5–8.1)

## 2016-05-10 LAB — POC URINE PREG, ED: PREG TEST UR: NEGATIVE

## 2016-05-10 LAB — URINALYSIS, ROUTINE W REFLEX MICROSCOPIC
BILIRUBIN URINE: NEGATIVE
Glucose, UA: NEGATIVE mg/dL
Hgb urine dipstick: NEGATIVE
Ketones, ur: NEGATIVE mg/dL
Leukocytes, UA: NEGATIVE
NITRITE: NEGATIVE
Protein, ur: NEGATIVE mg/dL
SPECIFIC GRAVITY, URINE: 1.016 (ref 1.005–1.030)
pH: 6 (ref 5.0–8.0)

## 2016-05-10 LAB — CBC
HCT: 39.6 % (ref 36.0–46.0)
Hemoglobin: 13.1 g/dL (ref 12.0–15.0)
MCH: 30 pg (ref 26.0–34.0)
MCHC: 33.1 g/dL (ref 30.0–36.0)
MCV: 90.8 fL (ref 78.0–100.0)
PLATELETS: 412 10*3/uL — AB (ref 150–400)
RBC: 4.36 MIL/uL (ref 3.87–5.11)
RDW: 13.6 % (ref 11.5–15.5)
WBC: 14.4 10*3/uL — ABNORMAL HIGH (ref 4.0–10.5)

## 2016-05-10 LAB — LIPASE, BLOOD: Lipase: 17 U/L (ref 11–51)

## 2016-05-10 MED ORDER — PANTOPRAZOLE SODIUM 20 MG PO TBEC: 20.0000 mg | DELAYED_RELEASE_TABLET | Freq: Every day | ORAL | 0 refills | Status: AC

## 2016-05-10 NOTE — Progress Notes (Signed)
EDCM spoke to patient at bedside. Patient confirms she does not have a pcp or insurance living in [place].  EDCM provided patient with contact information to [facility], informed patient of services there and walk in times.  EDCM also provided patient with list of pcps who accept self pay patients, list of discount pharmacies and websites needymeds.org and GoodRX.com for medication assistance, phone number to inquire about the orange card, phone number to inquire about Medicaid, phone number to inquire about the Affordable Care Act, financial resources in the community such as local churches, [employer], urban ministries, and dental assistance for uninsured patients.  Patient thankful for resources.  No further EDCM needs at this time. 

## 2016-05-10 NOTE — ED Provider Notes (Signed)
[facility]-EMERGENCY DEPT Provider Note   CSN: [id number] Arrival date & time: 05/10/16  1845  By signing my name below, I, [name], attest that this documentation has been prepared under the direction and in the presence of [name], PA-C. Electronically Signed: [name], ED Scribe. 05/10/16. 8:15 PM.  History   Chief Complaint Chief Complaint  Patient presents with  . Abdominal Pain   The history is provided by the patient. No language interpreter was used.   HPI Comments: Rebecca Meyers is a 35 y.o. female with a hx of C-section who presents to the Emergency Department complaining of gradually worsening, intermittent vomiting and diarrhea onset 2 weeks ago. Pt reports associated nausea and upper abdominal pain that began this morning. Pt reports that her symptoms typically occur after eating a meal. Pt has worsening abdominal pain with standing. Pt's last BM was one day ago. She has been using Tums and Zantac for her symptoms as needed to no relief. She believes that her symptoms may be related to reflux. Pt gave birth to a son via C-Section one year ago. She denies fever, chest pain, SOB, hematochezia, or hematemesis. Pt is allergic to penicillins.  Past Medical History:  Diagnosis Date  . Asthma    allergy related    Patient Active Problem List   Diagnosis Date Noted  . Placental abruption in third trimester 02/25/2015  . Antepartum bleeding 02/24/2015    Past Surgical History:  Procedure Laterality Date  . CESAREAN SECTION    . CESAREAN SECTION N/A 02/25/2015   Procedure: CESAREAN SECTION WITH BILATERAL TUBAL LIGATION;  Surgeon: Philip Aspen, DO;  Location: [facility];  Service: Obstetrics;  Laterality: N/A;  . WISDOM TOOTH EXTRACTION Bilateral 2007   all wisdom teeth extracted    OB History    Gravida Para Term Preterm AB Living   3 2 0 2 1 1    SAB TAB Ectopic Multiple Live Births   0 1 0 0 1      Obstetric Comments   Patient on buprenorphine       Home Medications    Prior to Admission medications   Medication Sig Start Date End Date Taking? Authorizing Provider  albuterol (PROVENTIL HFA;VENTOLIN HFA) 108 (90 BASE) MCG/ACT inhaler Inhale 2 puffs into the lungs every 6 (six) hours as needed. For shortness of breath    Yes Historical Provider, MD  buprenorphine (SUBUTEX) 2 MG SUBL SL tablet Place 6 mg under the tongue daily.   Yes Historical Provider, MD  fluticasone (FLONASE) 50 MCG/ACT nasal spray Place 2 sprays into both nostrils daily as needed for allergies.   Yes Historical Provider, MD  ibuprofen (ADVIL,MOTRIN) 200 MG tablet Take 400-800 mg by mouth every 6 (six) hours as needed (for pain.).   Yes Historical Provider, MD  loratadine (CLARITIN) 10 MG tablet Take 10 mg by mouth daily as needed for allergies.   Yes Historical Provider, MD  omeprazole (PRILOSEC) 20 MG capsule Take 1 capsule (20 mg total) by mouth daily. 12/29/15  Yes Melton Krebs, PA-C  ranitidine (ZANTAC) 150 MG tablet Take 150 mg by mouth daily as needed for heartburn.   Yes Historical Provider, MD  HYDROcodone-acetaminophen (NORCO/VICODIN) 5-325 MG tablet Take 2 tablets by mouth every 4 (four) hours as needed. Patient not taking: Reported on 05/10/2016 09/27/15   Elson Areas, PA-C  ondansetron (ZOFRAN) 4 MG tablet Take 1 tablet (4 mg total) by mouth every 6 (six) hours. Patient not taking: Reported on 05/10/2016  12/29/15   Melton KrebsSamantha Nicole Riley, PA-C  oxyCODONE 10 MG TABS Take 1 tablet (10 mg total) by mouth every 4 (four) hours as needed for breakthrough pain (may have in addition to Percocet). Patient not taking: Reported on 05/10/2016 02/27/15   Carrington ClampMichelle Horvath, MD    Family History Family History  Problem Relation Age of Onset  . Hypertension Father   . Diabetes Maternal Grandmother   . Heart disease Maternal Grandmother   . Cancer Maternal Grandfather   . Cancer Paternal Grandmother   . Diabetes Paternal Grandfather   . Heart disease Paternal  Grandfather   . Stroke Paternal Grandfather     Social History Social History  Substance Use Topics  . Smoking status: Current Some Day Smoker    Packs/day: 0.50    Types: Cigarettes  . Smokeless tobacco: Never Used  . Alcohol use No     Allergies   Penicillins   Review of Systems Review of Systems  A complete 10 system review of systems was obtained and all systems are negative except as noted in the HPI and PMH.   Physical Exam Updated Vital Signs BP 128/75 (BP Location: Left Arm)   Pulse 62   Temp 98.4 F (36.9 C) (Oral)   Resp 16   SpO2 97%   Physical Exam  Constitutional: She is oriented to person, place, and time. Vital signs are normal. She appears well-developed and well-nourished.  HENT:  Head: Normocephalic and atraumatic.  Right Ear: Hearing normal.  Left Ear: Hearing normal.  Eyes: Conjunctivae and EOM are normal. Pupils are equal, round, and reactive to light.  Neck: Normal range of motion. Neck supple.  Cardiovascular: Normal rate, regular rhythm and normal heart sounds.  Exam reveals no gallop and no friction rub.   No murmur heard. Pulmonary/Chest: Effort normal and breath sounds normal. She has no wheezes.  Abdominal: Soft. Normal appearance and bowel sounds are normal. There is tenderness in the right upper quadrant and epigastric area. There is no rigidity, no rebound, no guarding, no CVA tenderness and negative Murphy's sign.  Musculoskeletal: Normal range of motion.  Neurological: She is alert and oriented to person, place, and time.  Skin: Skin is warm and dry.  Psychiatric: She has a normal mood and affect. Her speech is normal and behavior is normal. Thought content normal.  Nursing note and vitals reviewed.  ED Treatments / Results  DIAGNOSTIC STUDIES: Oxygen Saturation is 97% on RA, normal by my interpretation.    Labs (all labs ordered are listed, but only abnormal results are displayed) Labs Reviewed  CBC - Abnormal; Notable for the  following:       Result Value   WBC 14.4 (*)    Platelets 412 (*)    All other components within normal limits  COMPREHENSIVE METABOLIC PANEL - Abnormal; Notable for the following:    Potassium 3.3 (*)    All other components within normal limits  URINALYSIS, ROUTINE W REFLEX MICROSCOPIC (NOT AT [facility]) - Abnormal; Notable for the following:    APPearance CLOUDY (*)    All other components within normal limits  LIPASE, BLOOD  POC URINE PREG, ED    EKG  EKG Interpretation None       Radiology [place] Abdomen Limited Ruq  Result Date: 05/10/2016 CLINICAL DATA:  Acute right upper quadrant pain EXAM: US ABDOMEN LIMITED - RIGHT UPPER QUADRANT COMPARISON:  CT 12/29/2015 FINDINGS: Gallbladder: No gallstones or wall thickening visualized. No sonographic Murphy sign noted by sonographer.  Common bile duct: Diameter: 4.8 mm Liver: No focal lesion identified. Color Doppler flow documented within the portal venous system without occlusion. IMPRESSION: Unremarkable right upper quadrant abdominal ultrasound. Electronically Signed   By: Tollie Eth M.D.   On: 05/10/2016 21:26    Procedures Procedures (including critical care time)  Medications Ordered in ED Medications - No data to display   Initial Impression / Assessment and Plan / ED Course  I have reviewed the triage vital signs and the nursing notes.  Pertinent labs & imaging results that were available during my care of the patient were reviewed by me and considered in my medical decision making (see chart for details).  Clinical Course   Final Clinical Impressions(s) / ED Diagnoses  I have reviewed and evaluated the relevant laboratory values I have reviewed and evaluated the relevant imaging studies.  I have reviewed the relevant previous healthcare records.I obtained HPI from historian.  ED Course: 8:10 PM-Discussed treatment plan which includes labs and [place] with pt at bedside and pt agreed to plan.   Assessment: Patient is  nontoxic, nonseptic appearing, in no apparent distress.  Patient's pain and other symptoms adequately managed in emergency department. Labs, imaging and vitals reviewed. CBC with mild leukocytosis. CMP unremarkable. Lipase negative. RUQ US unremarkable.  Patient does not meet the SIRS or Sepsis criteria.  On repeat exam patient does not have a surgical abdomin and there are no peritoneal signs.  No indication of appendicitis, bowel obstruction, bowel perforation, cholecystitis, diverticulitis, PID or ectopic pregnancy.  Likely reflux related. Pt with infrequent Protonix intake. Counseled on use and limit irritants on stomach. Patient discharged home with symptomatic treatment and given strict instructions for follow-up with their primary care physician.  I have also discussed reasons to return immediately to the ER.  Patient expresses understanding and agrees with plan.  Disposition/Plan:  DC Home Additional Verbal discharge instructions given and discussed with patient.  Pt Instructed to f/u with PCP in the next week for evaluation and treatment of symptoms. Return precautions given Pt acknowledges and agrees with plan  Supervising Physician Mancel Bale, MD  Final diagnoses:  RUQ pain  Gastric reflux   I personally performed the services described in this documentation, which was scribed in my presence. The recorded information has been reviewed and is accurate.   New Prescriptions New Prescriptions   No medications on file     [name], PA-C 05/10/16 2131    Mancel Bale, MD 05/11/16 [phone]

## 2016-05-10 NOTE — ED Triage Notes (Signed)
Pt comes in to ED w/ c/o NVD x2 weeks accompanied by abdominal pain that began this morning. Abdomen soft and non tender. Pt c/o pain above umbilicus states "may be acid reflux. P/t takes tums and zantac for reflux at home. Pt reports vomiting occurs typically after meals. Pt AOx4. NAD noted.

## 2016-05-10 NOTE — Discharge Instructions (Signed)
Please read and follow all provided instructions.  Your diagnoses today include:  1. RUQ pain   2. Gastric reflux    Tests performed today include: Blood counts and electrolytes Blood tests to check liver and kidney function Blood tests to check pancreas function Urine test to look for infection and pregnancy (in women) Vital signs. See below for your results today.   Medications prescribed:   Take any prescribed medications only as directed.  Home care instructions:  Follow any educational materials contained in this packet.  Follow-up instructions: Please follow-up with your primary care provider in the next 2 days for further evaluation of your symptoms.    Return instructions:  SEEK IMMEDIATE MEDICAL ATTENTION IF: The pain does not go away or becomes severe  A temperature above 101F develops  Repeated vomiting occurs (multiple episodes)  The pain becomes localized to portions of the abdomen. The right side could possibly be appendicitis. In an adult, the left lower portion of the abdomen could be colitis or diverticulitis.  Blood is being passed in stools or vomit (bright red or black tarry stools)  You develop chest pain, difficulty breathing, dizziness or fainting, or become confused, poorly responsive, or inconsolable (young children) If you have any other emergent concerns regarding your health  Additional Information: Abdominal (belly) pain can be caused by many things. Your caregiver performed an examination and possibly ordered blood/urine tests and imaging (CT scan, x-rays, ultrasound). Many cases can be observed and treated at home after initial evaluation in the emergency department. Even though you are being discharged home, abdominal pain can be unpredictable. Therefore, you need a repeated exam if your pain does not resolve, returns, or worsens. Most patients with abdominal pain don't have to be admitted to the hospital or have surgery, but serious problems like  appendicitis and gallbladder attacks can start out as nonspecific pain. Many abdominal conditions cannot be diagnosed in one visit, so follow-up evaluations are very important.  Your vital signs today were: BP 128/75 (BP Location: Left Arm)    Pulse 62    Temp 98.4 F (36.9 C) (Oral)    Resp 16    Ht 5\' 5"  (1.651 m)    Wt 99.8 kg    SpO2 97%    BMI 36.61 kg/m  If your blood pressure (bp) was elevated above 135/85 this visit, please have this repeated by your doctor within one month. --------------

## 2019-02-03 ENCOUNTER — Emergency Department (HOSPITAL_COMMUNITY): Payer: Self-pay

## 2019-02-03 ENCOUNTER — Other Ambulatory Visit: Payer: Self-pay

## 2019-02-03 ENCOUNTER — Encounter (HOSPITAL_COMMUNITY): Payer: Self-pay

## 2019-02-03 ENCOUNTER — Emergency Department (HOSPITAL_COMMUNITY)
Admission: EM | Admit: 2019-02-03 | Discharge: 2019-02-03 | Disposition: A | Discharge status: Home / Self Care | Payer: Self-pay | Attending: Emergency Medicine | Admitting: Emergency Medicine

## 2019-02-03 DIAGNOSIS — R1031 Right lower quadrant pain: Secondary | ICD-10-CM | POA: Insufficient documentation

## 2019-02-03 DIAGNOSIS — R109 Unspecified abdominal pain: Secondary | ICD-10-CM

## 2019-02-03 DIAGNOSIS — J45909 Unspecified asthma, uncomplicated: Secondary | ICD-10-CM | POA: Insufficient documentation

## 2019-02-03 DIAGNOSIS — F17210 Nicotine dependence, cigarettes, uncomplicated: Secondary | ICD-10-CM | POA: Insufficient documentation

## 2019-02-03 DIAGNOSIS — Z79899 Other long term (current) drug therapy: Secondary | ICD-10-CM | POA: Insufficient documentation

## 2019-02-03 LAB — URINALYSIS, ROUTINE W REFLEX MICROSCOPIC
Bacteria, UA: NONE SEEN
Bilirubin Urine: NEGATIVE
Glucose, UA: NEGATIVE mg/dL
Ketones, ur: NEGATIVE mg/dL
Leukocytes,Ua: NEGATIVE
Nitrite: NEGATIVE
Protein, ur: NEGATIVE mg/dL
Specific Gravity, Urine: 1.017 (ref 1.005–1.030)
pH: 5 (ref 5.0–8.0)

## 2019-02-03 LAB — CBC WITH DIFFERENTIAL/PLATELET
Abs Immature Granulocytes: 0.03 10*3/uL (ref 0.00–0.07)
Basophils Absolute: 0 10*3/uL (ref 0.0–0.1)
Basophils Relative: 0 %
Eosinophils Absolute: 0.2 10*3/uL (ref 0.0–0.5)
Eosinophils Relative: 2 %
HCT: 41.8 % (ref 36.0–46.0)
Hemoglobin: 13.6 g/dL (ref 12.0–15.0)
Immature Granulocytes: 0 %
Lymphocytes Relative: 20 %
Lymphs Abs: 1.9 10*3/uL (ref 0.7–4.0)
MCH: 31.1 pg (ref 26.0–34.0)
MCHC: 32.5 g/dL (ref 30.0–36.0)
MCV: 95.7 fL (ref 80.0–100.0)
Monocytes Absolute: 0.7 10*3/uL (ref 0.1–1.0)
Monocytes Relative: 8 %
Neutro Abs: 6.4 10*3/uL (ref 1.7–7.7)
Neutrophils Relative %: 70 %
Platelets: 376 10*3/uL (ref 150–400)
RBC: 4.37 MIL/uL (ref 3.87–5.11)
RDW: 13.2 % (ref 11.5–15.5)
WBC: 9.2 10*3/uL (ref 4.0–10.5)
nRBC: 0 % (ref 0.0–0.2)

## 2019-02-03 LAB — COMPREHENSIVE METABOLIC PANEL
ALT: 23 U/L (ref 0–44)
AST: 20 U/L (ref 15–41)
Albumin: 3.7 g/dL (ref 3.5–5.0)
Alkaline Phosphatase: 60 U/L (ref 38–126)
Anion gap: 7 (ref 5–15)
BUN: 11 mg/dL (ref 6–20)
CO2: 22 mmol/L (ref 22–32)
Calcium: 8.8 mg/dL — ABNORMAL LOW (ref 8.9–10.3)
Chloride: 107 mmol/L (ref 98–111)
Creatinine, Ser: 0.73 mg/dL (ref 0.44–1.00)
GFR calc Af Amer: >60 mL/min (ref >60)
GFR calc non Af Amer: >60 mL/min (ref >60)
Glucose, Bld: 96 mg/dL (ref 70–99)
Potassium: 4.5 mmol/L (ref 3.5–5.1)
Sodium: 136 mmol/L (ref 135–145)
Total Bilirubin: 0.6 mg/dL (ref 0.3–1.2)
Total Protein: 7.3 g/dL (ref 6.5–8.1)

## 2019-02-03 LAB — I-STAT BETA HCG BLOOD, ED (MC, WL, AP ONLY): I-stat hCG, quantitative: <5 m[IU]/mL (ref <5)

## 2019-02-03 MED ORDER — KETOROLAC TROMETHAMINE 30 MG/ML IJ SOLN
30.0000 mg | Freq: Once | INTRAMUSCULAR | Status: AC
  Administered 2019-02-03: 30 mg via INTRAVENOUS
  Filled 2019-02-03: qty 1

## 2019-02-03 NOTE — ED Provider Notes (Signed)
[facility] DEPT Provider Note   CSN: [id number] Arrival date & time: 02/03/19  3790     History   Chief Complaint Chief Complaint  Patient presents with   Pelvic Pain    HPI Rebecca Meyers is a 38 y.o. female.     Patient complains of pain in her right flank, around to her abdomen.  This seems to be worse with movement.  This been going on for a few days, no urinary tract symptoms  The history is provided by the patient. No language interpreter was used.  Flank Pain This is a new problem. The current episode started 2 days ago. The problem occurs constantly. The problem has not changed since onset.Pertinent negatives include no chest pain, no abdominal pain and no headaches. The symptoms are aggravated by twisting. Nothing relieves the symptoms. She has tried nothing for the symptoms. The treatment provided no relief.    Past Medical History:  Diagnosis Date   Asthma    allergy related    Patient Active Problem List   Diagnosis Date Noted   Placental abruption in third trimester 02/25/2015   Antepartum bleeding 02/24/2015    Past Surgical History:  Procedure Laterality Date   CESAREAN SECTION     CESAREAN SECTION N/A 02/25/2015   Procedure: CESAREAN SECTION WITH BILATERAL TUBAL LIGATION;  Surgeon: Allyn Kenner, DO;  Location: [facility];  Service: Obstetrics;  Laterality: N/A;   WISDOM TOOTH EXTRACTION Bilateral 2007   all wisdom teeth extracted     OB History    Gravida  3   Para  2   Term  0   Preterm  2   AB  1   Living  1     SAB  0   TAB  1   Ectopic  0   Multiple  0   Live Births  1        Obstetric Comments  Patient on buprenorphine          Home Medications    Prior to Admission medications   Medication Sig Start Date End Date Taking? Authorizing Provider  acetaminophen (TYLENOL) 500 MG tablet Take 1,000 mg by mouth as needed for moderate pain.   Yes [provider]  albuterol  (PROVENTIL HFA;VENTOLIN HFA) 108 (90 BASE) MCG/ACT inhaler Inhale 2 puffs into the lungs every 6 (six) hours as needed. For shortness of breath    Yes [provider]  buprenorphine (SUBUTEX) 2 MG SUBL SL tablet Place 6 mg under the tongue daily.   Yes [provider]  fluticasone (FLONASE) 50 MCG/ACT nasal spray Place 2 sprays into both nostrils daily as needed for allergies.   Yes [provider]  ibuprofen (ADVIL,MOTRIN) 200 MG tablet Take 800 mg by mouth as needed for moderate pain (for pain).    Yes [provider]  loratadine (CLARITIN) 10 MG tablet Take 10 mg by mouth daily as needed for allergies.   Yes [provider]  HYDROcodone-acetaminophen (NORCO/VICODIN) 5-325 MG tablet Take 2 tablets by mouth every 4 (four) hours as needed. Patient not taking: Reported on 05/10/2016 09/27/15   Fransico Meadow, PA-C  omeprazole (PRILOSEC) 20 MG capsule Take 1 capsule (20 mg total) by mouth daily. Patient not taking: Reported on 02/03/2019 12/29/15   Salida Lions, PA-C  ondansetron (ZOFRAN) 4 MG tablet Take 1 tablet (4 mg total) by mouth every 6 (six) hours. Patient not taking: Reported on 05/10/2016 12/29/15   Ovid Curd,  Montez MoritaSamantha Nicole, PA-C  oxyCODONE 10 MG TABS Take 1 tablet (10 mg total) by mouth every 4 (four) hours as needed for breakthrough pain (may have in addition to Percocet). Patient not taking: Reported on 05/10/2016 02/27/15   Carrington ClampHorvath, Michelle, MD  pantoprazole (PROTONIX) 20 MG tablet Take 1 tablet (20 mg total) by mouth daily. Patient not taking: Reported on 02/03/2019 05/10/16   Audry PiliMohr, Tyler, PA-C    Family History Family History  Problem Relation Age of Onset   Hypertension Father    Diabetes Maternal Grandmother    Heart disease Maternal Grandmother    Cancer Maternal Grandfather    Cancer Paternal Grandmother    Diabetes Paternal Grandfather    Heart disease Paternal Grandfather    Stroke Paternal Grandfather      Social History Social History   Tobacco Use   Smoking status: Current Some Day Smoker    Packs/day: 0.50    Types: Cigarettes   Smokeless tobacco: Never Used  Substance Use Topics   Alcohol use: No   Drug use: No     Allergies   Penicillins   Review of Systems Review of Systems  Constitutional: Negative for appetite change and fatigue.  HENT: Negative for congestion, ear discharge and sinus pressure.   Eyes: Negative for discharge.  Respiratory: Negative for cough.   Cardiovascular: Negative for chest pain.  Gastrointestinal: Negative for abdominal pain and diarrhea.  Genitourinary: Positive for flank pain. Negative for frequency and hematuria.  Musculoskeletal: Negative for back pain.  Skin: Negative for rash.  Neurological: Negative for seizures and headaches.  Psychiatric/Behavioral: Negative for hallucinations.     Physical Exam Updated Vital Signs BP 138/87 (BP Location: Right Arm)    Pulse 82    Temp 98 F (36.7 C) (Oral)    Resp 18    Ht 5\' 5"  (1.651 m)    Wt 99.8 kg    SpO2 99%    BMI 36.61 kg/m   Physical Exam Vitals signs and nursing note reviewed.  Constitutional:      Appearance: She is well-developed.  HENT:     Head: Normocephalic.     Nose: Nose normal.  Eyes:     General: No scleral icterus.    Conjunctiva/sclera: Conjunctivae normal.  Neck:     Musculoskeletal: Neck supple.     Thyroid: No thyromegaly.  Cardiovascular:     Rate and Rhythm: Normal rate and regular rhythm.     Heart sounds: No murmur. No friction rub. No gallop.   Pulmonary:     Breath sounds: No stridor. No wheezing or rales.  Chest:     Chest wall: No tenderness.  Abdominal:     General: There is no distension.     Tenderness: There is no abdominal tenderness. There is no rebound.  Musculoskeletal:     Comments: Tender right flank  Lymphadenopathy:     Cervical: No cervical adenopathy.  Skin:    Findings: No erythema or rash.  Neurological:     Mental  Status: She is oriented to person, place, and time.     Motor: No abnormal muscle tone.     Coordination: Coordination normal.  Psychiatric:        Behavior: Behavior normal.      ED Treatments / Results  Labs (all labs ordered are listed, but only abnormal results are displayed) Labs Reviewed  URINALYSIS, ROUTINE W REFLEX MICROSCOPIC - Abnormal; Notable for the following components:      Result Value  APPearance HAZY (*)    Hgb urine dipstick SMALL (*)    All other components within normal limits  COMPREHENSIVE METABOLIC PANEL - Abnormal; Notable for the following components:   Calcium 8.8 (*)    All other components within normal limits  CBC WITH DIFFERENTIAL/PLATELET  I-STAT BETA HCG BLOOD, ED ([facility], [facility], [facility] ONLY)  I-STAT BETA HCG BLOOD, ED ([facility], [facility], [facility] ONLY)    EKG None  Radiology Ct Renal Stone Study  Result Date: 02/03/2019 CLINICAL DATA:  RIGHT lower quadrant pain and RIGHT flank pain. EXAM: CT ABDOMEN AND PELVIS WITHOUT CONTRAST TECHNIQUE: Multidetector CT imaging of the abdomen and pelvis was performed following the standard protocol without IV contrast. COMPARISON:  CT abdomen dated 12/29/2015. FINDINGS: Lower chest: No acute abnormality. Hepatobiliary: No focal liver abnormality is seen. No gallstones, gallbladder wall thickening, or biliary dilatation. Pancreas: Unremarkable. No pancreatic ductal dilatation or surrounding inflammatory changes. Spleen: Unremarkable, stable. Adrenals/Urinary Tract: Adrenal glands appear normal. Kidneys are unremarkable without mass, stone or hydronephrosis. No ureteral or bladder calculi identified. Stomach/Bowel: No dilated large or small bowel loops. No evidence of bowel wall inflammation. Appendix is normal. Stomach is unremarkable, partially decompressed. Moderate amount of stool and gas throughout the nondistended colon. Vascular/Lymphatic: No significant vascular findings are present. No enlarged abdominal or pelvic lymph nodes.  Reproductive: Tubal ligation clips in place. No adnexal mass or free fluid seen. Other: No free fluid or abscess collection. No free intraperitoneal air. Musculoskeletal: No acute or suspicious osseous finding. IMPRESSION: No acute findings within the abdomen or pelvis. Appendix is normal. No renal or ureteral calculi. No bowel obstruction or evidence of bowel wall inflammation. Moderate amount of stool and gas throughout the nondistended colon (constipation? ). Electronically Signed   By: Bary RichardStan  Maynard M.D.   On: 02/03/2019 08:46    Procedures Procedures (including critical care time)  Medications Ordered in ED Medications  ketorolac (TORADOL) 30 MG/ML injection 30 mg (30 mg Intravenous Given 02/03/19 0758)     Initial Impression / Assessment and Plan / ED Course  I have reviewed the triage vital signs and the nursing notes.  Pertinent labs & imaging results that were available during my care of the patient were reviewed by me and considered in my medical decision making (see chart for details).        Labs and CT scan unremarkable except for mild constipation.  Patient will take Tylenol and Motrin for possible muscle pain.  Her blood pressure was mildly elevated and she will have that rechecked in a couple weeks  Final Clinical Impressions(s) / ED Diagnoses   Final diagnoses:  Flank pain    ED Discharge Orders    None       Bethann BerkshireZammit, Sharmain Lastra, MD 02/03/19 [phone]

## 2019-02-03 NOTE — Discharge Instructions (Addendum)
Take Tylenol and Motrin for discomfort.  Follow-up if not improving.  Have your blood pressure checked in the next couple weeks

## 2019-02-03 NOTE — ED Triage Notes (Signed)
Pt reports pelvic and lower back pain x 1 week. Denies hematuria. Pt states that she was at [facility] the week of the 4th, but did not have contact with anyone other than family. Denies SOB or cough, but endorses fatigue and states, "I'm not really feeling myself." A&Ox4. Ambulatory.

## 2022-10-23 ENCOUNTER — Other Ambulatory Visit: Payer: Self-pay

## 2022-10-23 ENCOUNTER — Emergency Department (HOSPITAL_COMMUNITY)
Admission: EM | Admit: 2022-10-23 | Discharge: 2022-10-23 | Disposition: A | Discharge status: Home / Self Care | Payer: Self-pay | Attending: Emergency Medicine | Admitting: Emergency Medicine

## 2022-10-23 ENCOUNTER — Emergency Department (HOSPITAL_COMMUNITY): Payer: Self-pay

## 2022-10-23 DIAGNOSIS — I10 Essential (primary) hypertension: Secondary | ICD-10-CM | POA: Insufficient documentation

## 2022-10-23 DIAGNOSIS — N179 Acute kidney failure, unspecified: Secondary | ICD-10-CM | POA: Insufficient documentation

## 2022-10-23 DIAGNOSIS — Z79899 Other long term (current) drug therapy: Secondary | ICD-10-CM | POA: Insufficient documentation

## 2022-10-23 DIAGNOSIS — R519 Headache, unspecified: Secondary | ICD-10-CM

## 2022-10-23 LAB — BASIC METABOLIC PANEL
Anion gap: 7 (ref 5–15)
BUN: 15 mg/dL (ref 6–20)
CO2: 27 mmol/L (ref 22–32)
Calcium: 9.3 mg/dL (ref 8.9–10.3)
Chloride: 104 mmol/L (ref 98–111)
Creatinine, Ser: 1.24 mg/dL — ABNORMAL HIGH (ref 0.44–1.00)
GFR, Estimated: 55 mL/min — ABNORMAL LOW (ref >60)
Glucose, Bld: 100 mg/dL — ABNORMAL HIGH (ref 70–99)
Potassium: 4.1 mmol/L (ref 3.5–5.1)
Sodium: 138 mmol/L (ref 135–145)

## 2022-10-23 LAB — CBC
HCT: 45.8 % (ref 36.0–46.0)
Hemoglobin: 15.1 g/dL — ABNORMAL HIGH (ref 12.0–15.0)
MCH: 31.1 pg (ref 26.0–34.0)
MCHC: 33 g/dL (ref 30.0–36.0)
MCV: 94.4 fL (ref 80.0–100.0)
Platelets: 400 10*3/uL (ref 150–400)
RBC: 4.85 MIL/uL (ref 3.87–5.11)
RDW: 13 % (ref 11.5–15.5)
WBC: 8 10*3/uL (ref 4.0–10.5)
nRBC: 0 % (ref 0.0–0.2)

## 2022-10-23 LAB — I-STAT BETA HCG BLOOD, ED (MC, WL, AP ONLY): I-stat hCG, quantitative: <5 m[IU]/mL (ref <5)

## 2022-10-23 MED ORDER — DIPHENHYDRAMINE HCL 50 MG/ML IJ SOLN
25.0000 mg | Freq: Once | INTRAMUSCULAR | Status: AC
  Administered 2022-10-23: 25 mg via INTRAVENOUS
  Filled 2022-10-23: qty 1

## 2022-10-23 MED ORDER — METOCLOPRAMIDE HCL 5 MG/ML IJ SOLN
10.0000 mg | Freq: Once | INTRAMUSCULAR | Status: AC
  Administered 2022-10-23: 10 mg via INTRAVENOUS
  Filled 2022-10-23: qty 2

## 2022-10-23 MED ORDER — SODIUM CHLORIDE 0.9 % IV BOLUS
500.0000 mL | Freq: Once | INTRAVENOUS | Status: AC
  Administered 2022-10-23: 500 mL via INTRAVENOUS

## 2022-10-23 MED ORDER — AMLODIPINE BESYLATE 5 MG PO TABS: 5.0000 mg | ORAL_TABLET | Freq: Every day | ORAL | 1 refills | Status: AC

## 2022-10-23 MED ORDER — KETOROLAC TROMETHAMINE 15 MG/ML IJ SOLN
15.0000 mg | Freq: Once | INTRAMUSCULAR | Status: AC
Start: 2022-10-23 — End: 2022-10-23
  Administered 2022-10-23: 15 mg via INTRAVENOUS
  Filled 2022-10-23: qty 1

## 2022-10-23 NOTE — ED Provider Notes (Signed)
[place] EMERGENCY DEPARTMENT AT [facility] Provider Note   CSN: [id number] Arrival date & time: 10/23/22  1341     History  Chief Complaint  Patient presents with   Hypertension   Headache    Rebecca Meyers is a 42 y.o. female.   Hypertension Associated symptoms include headaches.  Headache   42 year old female presents emergency department with complaints of high blood pressure and headache.  Patient states that she began to have hot flashes over the past several days.  She reported headache beginning gradually 3 to 4 days ago of which has been persistently mild since onset.  Reports history of headaches and states that this is "very mild compared to others I have experienced."  States that over the past 12 to 18 months has had increasing time in between menstrual cycles occurring 1 to 3 months apart of which is new for her as she is used to monthly menstrual cycles.  She is concerned about possible perimenopause but has not followed up with primary care regarding symptoms.  She took her blood pressure 3 days ago due to concerns of possible high blood pressure causing her hot flashes of which is elevated 170 systolic over 100.  States that her family was concerned and she eventually came to the emergency department 3 days later.  Does not take any medication for blood pressure but has had to be on medication for gestational hypertension.  Denies fever, chills, night sweats, chest pain, shortness of breath, abdominal pain, nausea, vomiting, urinary symptoms, change in bowel habits.  Denies visual disturbance, gait abnormality, weakness/sensory deficits in upper or lower extremities, slurred speech, facial droop.  Past medical history significant for asthma  Home Medications Prior to Admission medications   Medication Sig Start Date End Date Taking? Authorizing Provider  amLODipine (NORVASC) 5 MG tablet Take 1 tablet (5 mg total) by mouth daily. 10/23/22  Yes Sherian Maroon A, PA  acetaminophen (TYLENOL) 500 MG tablet Take 1,000 mg by mouth as needed for moderate pain.    [provider]  albuterol (PROVENTIL HFA;VENTOLIN HFA) 108 (90 BASE) MCG/ACT inhaler Inhale 2 puffs into the lungs every 6 (six) hours as needed. For shortness of breath     [provider]  buprenorphine (SUBUTEX) 2 MG SUBL SL tablet Place 6 mg under the tongue daily.    [provider]  fluticasone (FLONASE) 50 MCG/ACT nasal spray Place 2 sprays into both nostrils daily as needed for allergies.    [provider]  HYDROcodone-acetaminophen (NORCO/VICODIN) 5-325 MG tablet Take 2 tablets by mouth every 4 (four) hours as needed. Patient not taking: Reported on 05/10/2016 09/27/15   Elson Areas, PA-C  ibuprofen (ADVIL,MOTRIN) 200 MG tablet Take 800 mg by mouth as needed for moderate pain (for pain).     [provider]  loratadine (CLARITIN) 10 MG tablet Take 10 mg by mouth daily as needed for allergies.    [provider]  omeprazole (PRILOSEC) 20 MG capsule Take 1 capsule (20 mg total) by mouth daily. Patient not taking: Reported on 02/03/2019 12/29/15   Melton Krebs, PA-C  ondansetron (ZOFRAN) 4 MG tablet Take 1 tablet (4 mg total) by mouth every 6 (six) hours. Patient not taking: Reported on 05/10/2016 12/29/15   Melton Krebs, PA-C  oxyCODONE 10 MG TABS Take 1 tablet (10 mg total) by mouth every 4 (four) hours as needed for breakthrough pain (may have in addition to Percocet). Patient not taking:  Reported on 05/10/2016 02/27/15   Carrington ClampHorvath, Michelle, MD  pantoprazole (PROTONIX) 20 MG tablet Take 1 tablet (20 mg total) by mouth daily. Patient not taking: Reported on 02/03/2019 05/10/16   Audry PiliMohr, Tyler, PA-C      Allergies    Penicillins    Review of Systems   Review of Systems  Neurological:  Positive for headaches.  All other systems reviewed and are negative.   Physical Exam Updated Vital Signs BP (!) 173/117  (BP Location: Left Arm)   Pulse 91   Temp 98.7 F (37.1 C) (Oral)   Resp 16   Ht 5\' 5"  (1.651 m)   Wt 90.7 kg   SpO2 100%   BMI 33.28 kg/m  Physical Exam Vitals and nursing note reviewed.  Constitutional:      General: She is not in acute distress.    Appearance: She is well-developed.  HENT:     Head: Normocephalic and atraumatic.  Eyes:     Conjunctiva/sclera: Conjunctivae normal.  Cardiovascular:     Rate and Rhythm: Normal rate and regular rhythm.     Heart sounds: No murmur heard. Pulmonary:     Effort: Pulmonary effort is normal. No respiratory distress.     Breath sounds: Normal breath sounds.  Abdominal:     Palpations: Abdomen is soft.     Tenderness: There is no abdominal tenderness.  Musculoskeletal:        General: No swelling.     Cervical back: Neck supple.  Skin:    General: Skin is warm and dry.     Capillary Refill: Capillary refill takes less than 2 seconds.  Neurological:     Mental Status: She is alert.     Comments: Alert and oriented to self, place, time and event.   Speech is fluent, clear without dysarthria or dysphasia.   Strength 5/5 in upper/lower extremities   Sensation intact in upper/lower extremities   Normal gait.  Negative Romberg. No pronator drift.  Normal finger-to-nose and feet tapping.  CN I not tested  CN II not tested CN III, IV, VI PERRLA and EOMs intact bilaterally  CN V Intact sensation to sharp and light touch to the face  CN VII facial movements symmetric  CN VIII not tested  CN IX, X no uvula deviation, symmetric rise of soft palate  CN XI 5/5 SCM and trapezius strength bilaterally  CN XII Midline tongue protrusion, symmetric L/R movements     Psychiatric:        Mood and Affect: Mood normal.     ED Results / Procedures / Treatments   Labs (all labs ordered are listed, but only abnormal results are displayed) Labs Reviewed  BASIC METABOLIC PANEL - Abnormal; Notable for the following components:       Result Value   Glucose, Bld 100 (*)    Creatinine, Ser 1.24 (*)    GFR, Estimated 55 (*)    All other components within normal limits  CBC - Abnormal; Notable for the following components:   Hemoglobin 15.1 (*)    All other components within normal limits  I-STAT BETA HCG BLOOD, ED ([facility], [facility], [facility] ONLY)    EKG EKG Interpretation  Date/Time:  Sunday October 23 2022 16:41:15 EDT Ventricular Rate:  63 PR Interval:  165 QRS Duration: 105 QT Interval:  415 QTC Calculation: 425 R Axis:   -12 Text Interpretation: Sinus rhythm Inferior Q waves noted No old tracing to compare Confirmed by Linwood DibblesKnapp, Jon [phone]([id number]) on 10/23/2022  4:48:01 PM  Radiology CT Head Wo Contrast  Result Date: 10/23/2022 CLINICAL DATA:  Headache with elevated blood pressure. EXAM: CT HEAD WITHOUT CONTRAST TECHNIQUE: Contiguous axial images were obtained from the base of the skull through the vertex without intravenous contrast. RADIATION DOSE REDUCTION: This exam was performed according to the departmental dose-optimization program which includes automated exposure control, adjustment of the mA and/or kV according to patient size and/or use of iterative reconstruction technique. COMPARISON:  CT head 03/17/2004 FINDINGS: Brain: No evidence of acute infarction, hemorrhage, hydrocephalus, extra-axial collection or mass lesion/mass effect. 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 Vascular: No hyperdense vessel or unexpected calcification. Skull: Normal. Negative for fracture or focal lesion. Sinuses/Orbits: No acute finding. Other: None. IMPRESSION: No acute intracranial abnormality. Electronically Signed   By: Darliss Cheney M.D.   On: 10/23/2022 17:01    Procedures Procedures    Medications Ordered in ED Medications  sodium chloride 0.9 % bolus 500 mL (0 mLs Intravenous Stopped 10/23/22 1654)  metoCLOPramide (REGLAN) injection 10 mg (10 mg Intravenous Given 10/23/22 1533)  diphenhydrAMINE (BENADRYL)  injection 25 mg (25 mg Intravenous Given 10/23/22 1532)  ketorolac (TORADOL) 15 MG/ML injection 15 mg (15 mg Intravenous Given 10/23/22 1534)    ED Course/ Medical Decision Making/ A&P                             Medical Decision Making Amount and/or Complexity of Data Reviewed Labs: ordered. Radiology: ordered.  Risk Prescription drug management.   This patient presents to the ED for concern of high blood pressure, headache, this involves an extensive number of treatment options, and is a complaint that carries with it a high risk of complications and morbidity.  The differential diagnosis includes AKI/CKD, CVA, meningitis/encephalitis, cerebral venous thrombosis, malignancy, press, carotid/vertebral artery dissection   Co morbidities that complicate the patient evaluation  See HPI   Additional history obtained:  Additional history obtained from EMR External records from outside source obtained and reviewed including hospital records   Lab Tests:  I Ordered, and personally interpreted labs.  The pertinent results include: No leukocytosis noted.  No evidence anemia.  Platelets within range.  No electrolyte abnormalities.  Renal function with elevated creatinine 1.24 decreased GFR 55   Imaging Studies ordered:  I ordered imaging studies including CT head I independently visualized and interpreted imaging which showed no acute intracranial abnormalities I agree with the radiologist interpretation   Cardiac Monitoring: / EKG:  The patient was maintained on a cardiac monitor.  I personally viewed and interpreted the cardiac monitored which showed an underlying rhythm of: Sinus rhythm with inferior Q waves.  No evidence of acute ischemic change.   Consultations Obtained:  N/a   Problem List / ED Course / Critical interventions / Medication management  Headache, hypertension, AKI I ordered medication including 1 L with saline, Toradol, Reglan, Benadryl   Reevaluation of  the patient after these medicines showed that the patient improved I have reviewed the patients home medicines and have made adjustments as needed   Social Determinants of Health:  Chronic cigarette use.  Denies illicit drug use.   Test / Admission - Considered:  Headache, hypertension, AKI Vitals signs significant for hypertension with blood pressure 173/117. Otherwise within normal range and stable throughout visit. Laboratory/imaging studies significant for: See above 42 year old female presents emergency department with high blood pressure and headache. Patient neurologically intact without acute abnormalities  on CT scan of the head; low suspicion for CVA.  Patient noted resolution of symptoms with administration of migraine cocktail.  Patient evidence with evidence of AKI with creatinine elevated 1.24 from baseline around 0.7 from 3 years ago; unaware of patient's recent renal function as it could have been worsening secondary to uncontrolled hypertension over the past 3 years and her not following with primary care.  Given evidence of hypertension with evidence of presumed AKI, discussion was had with patient regarding admission for further assessment of patient's AKI patient declined at this time for outpatient management.  Will begin oral antihypertensive medication in the form of amlodipine given patient's acute kidney injury.  Patient educated regarding proper oral hydration as well as avoidance of nephrotoxic medications.  Patient given outpatient follow-up information for primary care.  Treatment plan discussed at length with patient and she acknowledged understanding was agreeable to said plan. Worrisome signs and symptoms were discussed with the patient, and the patient acknowledged understanding to return to the ED if noticed. Patient was stable upon discharge.          Final Clinical Impression(s) / ED Diagnoses Final diagnoses:  Hypertension, unspecified type  AKI (acute  kidney injury)  Acute nonintractable headache, unspecified headache type    Rx / DC Orders ED Discharge Orders          Ordered    amLODipine (NORVASC) 5 MG tablet  Daily        10/23/22 1717              Peter Garter, [place] 10/23/22 Lunette Stands, MD 10/25/22 [phone]

## 2022-10-23 NOTE — ED Triage Notes (Addendum)
Pt arrived via POV. C/o hot flashes, HA, and HTN for 3x days.   AOx4

## 2022-10-23 NOTE — Discharge Instructions (Addendum)
Note that your workup today was overall consistent with acute kidney injury.  This could be secondary to decreased liquid intake or because her blood pressure has been high.  Will send in a medication called amlodipine to take daily for elevated blood pressure.  Recommend close follow-up with primary care for repeat kidney function as well as monitoring of your blood pressure medication.  I will attach blood pressure log to use; take blood pressure once in the morning once at night as directed.  I will also attach medicines to avoid given evidence of acute kidney injury.  Please do not hesitate to return to the emergency department for worrisome signs and symptoms we discussed become apparent.

## 2023-07-24 ENCOUNTER — Other Ambulatory Visit: Payer: Self-pay

## 2023-07-24 ENCOUNTER — Emergency Department (HOSPITAL_COMMUNITY)
Admission: EM | Admit: 2023-07-24 | Discharge: 2023-07-24 | Disposition: A | Discharge status: Home / Self Care | Payer: Self-pay | Attending: Emergency Medicine | Admitting: Emergency Medicine

## 2023-07-24 ENCOUNTER — Emergency Department (HOSPITAL_COMMUNITY): Payer: Self-pay

## 2023-07-24 ENCOUNTER — Encounter (HOSPITAL_COMMUNITY): Payer: Self-pay | Admitting: *Deleted

## 2023-07-24 DIAGNOSIS — L03211 Cellulitis of face: Secondary | ICD-10-CM | POA: Insufficient documentation

## 2023-07-24 LAB — CBC WITH DIFFERENTIAL/PLATELET
Abs Immature Granulocytes: 0.04 10*3/uL (ref 0.00–0.07)
Basophils Absolute: 0.1 10*3/uL (ref 0.0–0.1)
Basophils Relative: 0 %
Eosinophils Absolute: 0.3 10*3/uL (ref 0.0–0.5)
Eosinophils Relative: 2 %
HCT: 40.9 % (ref 36.0–46.0)
Hemoglobin: 13.7 g/dL (ref 12.0–15.0)
Immature Granulocytes: 0 %
Lymphocytes Relative: 16 %
Lymphs Abs: 2.1 10*3/uL (ref 0.7–4.0)
MCH: 31.7 pg (ref 26.0–34.0)
MCHC: 33.5 g/dL (ref 30.0–36.0)
MCV: 94.7 fL (ref 80.0–100.0)
Monocytes Absolute: 0.8 10*3/uL (ref 0.1–1.0)
Monocytes Relative: 6 %
Neutro Abs: 10.3 10*3/uL — ABNORMAL HIGH (ref 1.7–7.7)
Neutrophils Relative %: 76 %
Platelets: 408 10*3/uL — ABNORMAL HIGH (ref 150–400)
RBC: 4.32 MIL/uL (ref 3.87–5.11)
RDW: 13.2 % (ref 11.5–15.5)
WBC: 13.6 10*3/uL — ABNORMAL HIGH (ref 4.0–10.5)
nRBC: 0 % (ref 0.0–0.2)

## 2023-07-24 LAB — BASIC METABOLIC PANEL
Anion gap: 10 (ref 5–15)
BUN: 11 mg/dL (ref 6–20)
CO2: 23 mmol/L (ref 22–32)
Calcium: 9.5 mg/dL (ref 8.9–10.3)
Chloride: 105 mmol/L (ref 98–111)
Creatinine, Ser: 0.6 mg/dL (ref 0.44–1.00)
GFR, Estimated: >60 mL/min (ref >60)
Glucose, Bld: 111 mg/dL — ABNORMAL HIGH (ref 70–99)
Potassium: 3.4 mmol/L — ABNORMAL LOW (ref 3.5–5.1)
Sodium: 138 mmol/L (ref 135–145)

## 2023-07-24 LAB — BRAIN NATRIURETIC PEPTIDE: B Natriuretic Peptide: 71.3 pg/mL (ref 0.0–100.0)

## 2023-07-24 MED ORDER — IBUPROFEN 600 MG PO TABS: 600.0000 mg | ORAL_TABLET | Freq: Four times a day (QID) | ORAL | 0 refills | Status: AC | PRN

## 2023-07-24 MED ORDER — HYDROMORPHONE HCL 1 MG/ML IJ SOLN
1.0000 mg | Freq: Once | INTRAMUSCULAR | Status: AC
  Administered 2023-07-24: 1 mg via INTRAVENOUS
  Filled 2023-07-24: qty 1

## 2023-07-24 MED ORDER — METRONIDAZOLE 500 MG/100ML IV SOLN
500.0000 mg | Freq: Once | INTRAVENOUS | Status: AC
  Administered 2023-07-24: 500 mg via INTRAVENOUS
  Filled 2023-07-24: qty 100

## 2023-07-24 MED ORDER — LEVOFLOXACIN 500 MG PO TABS: 500.0000 mg | ORAL_TABLET | Freq: Every day | ORAL | 0 refills | Status: AC

## 2023-07-24 MED ORDER — HYDROMORPHONE HCL 1 MG/ML IJ SOLN
0.5000 mg | Freq: Once | INTRAMUSCULAR | Status: AC
  Administered 2023-07-24: 0.5 mg via INTRAVENOUS
  Filled 2023-07-24: qty 1

## 2023-07-24 MED ORDER — LEVOFLOXACIN IN D5W 500 MG/100ML IV SOLN
500.0000 mg | INTRAVENOUS | Status: DC
  Administered 2023-07-24: 500 mg via INTRAVENOUS
  Filled 2023-07-24: qty 100

## 2023-07-24 MED ORDER — IOHEXOL 300 MG/ML  SOLN
75.0000 mL | Freq: Once | INTRAMUSCULAR | Status: AC | PRN
  Administered 2023-07-24: 75 mL via INTRAVENOUS

## 2023-07-24 MED ORDER — IBUPROFEN 200 MG PO TABS
600.0000 mg | ORAL_TABLET | Freq: Once | ORAL | Status: AC
  Administered 2023-07-24: 600 mg via ORAL
  Filled 2023-07-24: qty 3

## 2023-07-24 MED ORDER — OXYCODONE-ACETAMINOPHEN 5-325 MG PO TABS: 1.0000 | ORAL_TABLET | Freq: Four times a day (QID) | ORAL | 0 refills | Status: AC | PRN

## 2023-07-24 MED ORDER — HYDROCODONE-ACETAMINOPHEN 5-325 MG PO TABS
1.0000 | ORAL_TABLET | Freq: Once | ORAL | Status: AC
  Administered 2023-07-24: 1 via ORAL
  Filled 2023-07-24: qty 1

## 2023-07-24 NOTE — ED Provider Notes (Signed)
 [place] [facility] AT [facility] Provider Note   CSN: [id number] Arrival date & time: 07/24/23  1316     History  Chief Complaint  Patient presents with   Facial Swelling    Rebecca Meyers is a 43 y.o. female.  Patient to ED with right sided facial swelling from lower periorbital area to maxilla since last Wednesday, root canal on Friday with progressively worsening symptoms of pain and swelling despite abx use.  The history is provided by the patient. No language interpreter was used.       Home Medications Prior to Admission medications   Medication Sig Start Date End Date Taking? Authorizing Provider  ibuprofen  (ADVIL ) 600 MG tablet Take 1 tablet (600 mg total) by mouth every 6 (six) hours as needed. 07/24/23  Yes Koda Defrank, Margit, PA-C  levofloxacin  (LEVAQUIN ) 500 MG tablet Take 1 tablet (500 mg total) by mouth daily. 07/24/23  Yes Wilena Tyndall, Margit, PA-C  oxyCODONE -acetaminophen  (PERCOCET/ROXICET) 5-325 MG tablet Take 1 tablet by mouth every 6 (six) hours as needed for severe pain (pain score 7-10). 07/24/23  Yes Odell Margit, PA-C  acetaminophen  (TYLENOL ) 500 MG tablet Take 1,000 mg by mouth as needed for moderate pain.    [provider]  albuterol  (PROVENTIL  HFA;VENTOLIN  HFA) 108 (90 BASE) MCG/ACT inhaler Inhale 2 puffs into the lungs every 6 (six) hours as needed. For shortness of breath     [provider]  amLODipine  (NORVASC ) 5 MG tablet Take 1 tablet (5 mg total) by mouth daily. 10/23/22   Silver Wonda LABOR, PA  buprenorphine  (SUBUTEX ) 2 MG SUBL SL tablet Place 6 mg under the tongue daily.    [provider]  fluticasone (FLONASE) 50 MCG/ACT nasal spray Place 2 sprays into both nostrils daily as needed for allergies.    [provider]  HYDROcodone -acetaminophen  (NORCO/VICODIN) 5-325 MG tablet Take 2 tablets by mouth every 4 (four) hours as needed. Patient not taking: Reported on 05/10/2016 09/27/15   Sofia, Leslie K,  PA-C  loratadine (CLARITIN) 10 MG tablet Take 10 mg by mouth daily as needed for allergies.    [provider]  omeprazole  (PRILOSEC) 20 MG capsule Take 1 capsule (20 mg total) by mouth daily. Patient not taking: Reported on 02/03/2019 12/29/15   Carlo Lucie Garre, PA-C  ondansetron  (ZOFRAN ) 4 MG tablet Take 1 tablet (4 mg total) by mouth every 6 (six) hours. Patient not taking: Reported on 05/10/2016 12/29/15   Carlo Lucie Garre, PA-C  pantoprazole  (PROTONIX ) 20 MG tablet Take 1 tablet (20 mg total) by mouth daily. Patient not taking: Reported on 02/03/2019 05/10/16   Olympia Gee, PA-C      Allergies    Penicillins    Review of Systems   Review of Systems  Physical Exam Updated Vital Signs BP (!) 170/110   Pulse 79   Temp 98.2 F (36.8 C) (Oral)   Resp 18   Ht 5' 5 (1.651 m)   Wt 102.1 kg   SpO2 99%   BMI 37.44 kg/m  Physical Exam Vitals and nursing note reviewed.  Constitutional:      Appearance: She is well-developed.  HENT:     Head: Normocephalic.     Comments: Right face is moderately swollen involving the lower lid, cheek and maxilla.  Cardiovascular:     Rate and Rhythm: Normal rate and regular rhythm.     Heart sounds: No murmur heard. Pulmonary:     Effort: Pulmonary effort is normal.  Breath sounds: Normal breath sounds. No wheezing, rhonchi or rales.  Abdominal:     General: Bowel sounds are normal.     Palpations: Abdomen is soft.     Tenderness: There is no abdominal tenderness. There is no guarding or rebound.  Musculoskeletal:        General: Normal range of motion.     Cervical back: Normal range of motion and neck supple.  Skin:    General: Skin is warm and dry.  Neurological:     General: No focal deficit present.     Mental Status: She is alert and oriented to person, place, and time.     ED Results / Procedures / Treatments   Labs (all labs ordered are listed, but only abnormal results are displayed) Labs Reviewed   CBC WITH DIFFERENTIAL/PLATELET - Abnormal; Notable for the following components:      Result Value   WBC 13.6 (*)    Platelets 408 (*)    Neutro Abs 10.3 (*)    All other components within normal limits  BASIC METABOLIC PANEL - Abnormal; Notable for the following components:   Potassium 3.4 (*)    Glucose, Bld 111 (*)    All other components within normal limits  BRAIN NATRIURETIC PEPTIDE    EKG None  Radiology CT Maxillofacial W Contrast Result Date: 07/24/2023 CLINICAL DATA:  Initial evaluation for acute facial infection. EXAM: CT MAXILLOFACIAL WITH CONTRAST TECHNIQUE: Multidetector CT imaging of the maxillofacial structures was performed with intravenous contrast. Multiplanar CT image reconstructions were also generated. RADIATION DOSE REDUCTION: This exam was performed according to the departmental dose-optimization program which includes automated exposure control, adjustment of the mA and/or kV according to patient size and/or use of iterative reconstruction technique. CONTRAST:  75mL OMNIPAQUE  IOHEXOL  300 MG/ML  SOLN COMPARISON:  None Available. FINDINGS: Osseous: No acute osseous abnormality. No discrete or worrisome osseous lesions. Orbits: Globes orbital soft tissues within normal limits. No evidence for postseptal or intraorbital cellulitis. Sinuses: Moderate mucosal thickening present about the right ethmoidal air cells and right maxillary sinus. Paranasal sinuses are otherwise largely clear. Visualize mastoids and middle ear cavities are clear. Soft tissues: Soft tissue swelling with inflammatory stranding seen involving the right lower face, concerning for acute infection/cellulitis. Involvement of the right masticator and submandibular spaces with extension into the submental region. Hazy inflammatory stranding involves the right retro antral fat. No extension into the floor of mouth or sublingual space. No discrete abscess or drainable fluid collection. Limited intracranial:  Unremarkable. IMPRESSION: 1. Soft tissue swelling with inflammatory stranding involving the right lower face, concerning for acute infection/cellulitis. No discrete abscess or drainable fluid collection. 2. Moderate mucosal thickening about the right ethmoidal air cells and right maxillary sinus. Appropriate antibiotic tailoring to cover for potential sinus infection recommended. Electronically Signed   By: Morene Hoard M.D.   On: 07/24/2023 20:04    Procedures Procedures    Medications Ordered in ED Medications  levofloxacin  (LEVAQUIN ) IVPB 500 mg (has no administration in time range)  HYDROmorphone  (DILAUDID ) injection 1 mg (has no administration in time range)  HYDROcodone -acetaminophen  (NORCO/VICODIN) 5-325 MG per tablet 1 tablet (1 tablet Oral Given 07/24/23 1409)  metroNIDAZOLE  (FLAGYL ) IVPB 500 mg (0 mg Intravenous Stopped 07/24/23 2048)  iohexol  (OMNIPAQUE ) 300 MG/ML solution 75 mL (75 mLs Intravenous Contrast Given 07/24/23 1929)  HYDROmorphone  (DILAUDID ) injection 0.5 mg (0.5 mg Intravenous Given 07/24/23 1947)  ibuprofen  (ADVIL ) tablet 600 mg (600 mg Oral Given 07/24/23 2005)    ED  Course/ Medical Decision Making/ A&P Clinical Course as of 07/24/23 2101  Mon Jul 24, 2023  2052 Patient with facial infection, pain and swelling for one week. Had dental treatment without improvement in facial symptoms. Has been taking 1200 mg Clindamycin  daily without change. Discussed abx choice with pharmacy - Flagyl  and levaquin  provided IV.  CT maxillofacial w/CM:    IMPRESSION: 1. Soft tissue swelling with inflammatory stranding involving the right lower face, concerning for acute infection/cellulitis. No discrete abscess or drainable fluid collection. 2. Moderate mucosal thickening about the right ethmoidal air cells and right maxillary sinus. Appropriate antibiotic tailoring to cover for potential sinus infection recommended.   WBC 13.3. VSS, afebrile. Will change from Clindamycin  to  Levaquin . Will provide pain relief. Encourage close follow up with PCP.  [SU]    Clinical Course User Index [SU] Odell Balls, PA-C                                 Medical Decision Making Amount and/or Complexity of Data Reviewed Labs: ordered. Radiology: ordered.  Risk OTC drugs. Prescription drug management.           Final Clinical Impression(s) / ED Diagnoses Final diagnoses:  Facial cellulitis    Rx / DC Orders ED Discharge Orders          Ordered    oxyCODONE -acetaminophen  (PERCOCET/ROXICET) 5-325 MG tablet  Every 6 hours PRN        07/24/23 2059    ibuprofen  (ADVIL ) 600 MG tablet  Every 6 hours PRN        07/24/23 2059    levofloxacin  (LEVAQUIN ) 500 MG tablet  Daily        07/24/23 2059              Odell Balls, PA-C 07/24/23 2101    Doretha Folks, MD 07/24/23 2351

## 2023-07-24 NOTE — ED Triage Notes (Signed)
 Pt had a Root Canal on Friday, now has been told tooth is fine and she has an infection and needs to come to the ED. She has facial swelling and rt eye drainage.

## 2023-07-24 NOTE — Discharge Instructions (Signed)
 As we discussed, your CT shows facial soft tissue infection as well as a sinus infection. There is no fluid collection (ie abscess) to have drained.   Stop taking your Clindamycin  and take Levaquin  once daily for the next 10 days. Your doctor should recheck your condition in 3-4 days. Take pain medications as prescribed.

## 2023-07-24 NOTE — ED Provider Triage Note (Signed)
 Emergency Medicine Provider Triage Evaluation Note  Rebecca Meyers , a 43 y.o. female  was evaluated in triage.  Pt complains of facial swelling.  Review of Systems  Positive: Right facial swelling Negative: vomiting  Physical Exam  BP (!) 165/98   Pulse 85   Temp 99.6 F (37.6 C) (Oral)   Resp 18   Ht 5' 5 (1.651 m)   Wt 102.1 kg   SpO2 98%   BMI 37.44 kg/m  Gen:   Awake, no distress   Resp:  Normal effort  MSK:   Moves extremities without difficulty  Other:    Medical Decision Making  Medically screening exam initiated at 2:03 PM.  Appropriate orders placed.  Rebecca Meyers was informed that the remainder of the evaluation will be completed by another provider, this initial triage assessment does not replace that evaluation, and the importance of remaining in the ED until their evaluation is complete.  Has right facial swelling from periorbital area to maxilla since last Wednesday, root canal on Friday with progressively worsening symptoms of pain and swelling despite abx use.    Odell Balls, PA-C 07/24/23 1405

## 2023-07-24 NOTE — ED Notes (Signed)
 Patient transported to CT
